# Patient Record
Sex: Female | Born: 1940 | Race: Black or African American | Hispanic: No | Smoking: Former smoker
Health system: Southern US, Community
[De-identification: ages and names within clinical notes are randomized; demographics above are authoritative.]

## PROBLEM LIST (undated history)

## (undated) DIAGNOSIS — C50919 Malignant neoplasm of unspecified site of unspecified female breast: Secondary | ICD-10-CM

## (undated) DIAGNOSIS — E7800 Pure hypercholesterolemia, unspecified: Secondary | ICD-10-CM

## (undated) DIAGNOSIS — K219 Gastro-esophageal reflux disease without esophagitis: Secondary | ICD-10-CM

## (undated) DIAGNOSIS — M353 Polymyalgia rheumatica: Secondary | ICD-10-CM

## (undated) DIAGNOSIS — R011 Cardiac murmur, unspecified: Secondary | ICD-10-CM

## (undated) DIAGNOSIS — M1990 Unspecified osteoarthritis, unspecified site: Secondary | ICD-10-CM

## (undated) DIAGNOSIS — R519 Headache, unspecified: Secondary | ICD-10-CM

## (undated) DIAGNOSIS — I10 Essential (primary) hypertension: Secondary | ICD-10-CM

## (undated) DIAGNOSIS — H269 Unspecified cataract: Secondary | ICD-10-CM

## (undated) DIAGNOSIS — G5600 Carpal tunnel syndrome, unspecified upper limb: Secondary | ICD-10-CM

## (undated) DIAGNOSIS — N189 Chronic kidney disease, unspecified: Secondary | ICD-10-CM

## (undated) DIAGNOSIS — K589 Irritable bowel syndrome without diarrhea: Secondary | ICD-10-CM

## (undated) DIAGNOSIS — E119 Type 2 diabetes mellitus without complications: Secondary | ICD-10-CM

## (undated) DIAGNOSIS — E039 Hypothyroidism, unspecified: Secondary | ICD-10-CM

## (undated) DIAGNOSIS — M797 Fibromyalgia: Secondary | ICD-10-CM

## (undated) HISTORY — PX: TONSILLECTOMY: SUR1361

## (undated) HISTORY — PX: EYE SURGERY: SHX253

## (undated) HISTORY — PX: UPPER GI ENDOSCOPY: SHX6162

---

## 1993-09-08 HISTORY — PX: BREAST EXCISIONAL BIOPSY: SUR124

## 2000-09-08 DIAGNOSIS — C50919 Malignant neoplasm of unspecified site of unspecified female breast: Secondary | ICD-10-CM

## 2000-09-08 HISTORY — DX: Malignant neoplasm of unspecified site of unspecified female breast: C50.919

## 2000-09-08 HISTORY — PX: MASTECTOMY: SHX3

## 2004-10-30 ENCOUNTER — Ambulatory Visit: Payer: Self-pay | Admitting: Oncology

## 2005-03-12 ENCOUNTER — Ambulatory Visit: Payer: Self-pay | Admitting: Internal Medicine

## 2005-04-30 ENCOUNTER — Ambulatory Visit: Payer: Self-pay | Admitting: Oncology

## 2005-05-09 ENCOUNTER — Ambulatory Visit: Payer: Self-pay | Admitting: Oncology

## 2005-10-18 ENCOUNTER — Other Ambulatory Visit: Payer: Self-pay

## 2005-10-18 ENCOUNTER — Emergency Department: Payer: Self-pay | Admitting: Emergency Medicine

## 2005-10-27 ENCOUNTER — Ambulatory Visit: Payer: Self-pay | Admitting: Oncology

## 2005-11-06 ENCOUNTER — Ambulatory Visit: Payer: Self-pay | Admitting: Oncology

## 2006-04-13 ENCOUNTER — Ambulatory Visit: Payer: Self-pay | Admitting: Internal Medicine

## 2006-04-27 ENCOUNTER — Ambulatory Visit: Payer: Self-pay | Admitting: Oncology

## 2006-05-19 ENCOUNTER — Ambulatory Visit: Payer: Self-pay | Admitting: Internal Medicine

## 2006-10-26 ENCOUNTER — Ambulatory Visit: Payer: Self-pay | Admitting: Oncology

## 2006-11-12 ENCOUNTER — Ambulatory Visit: Payer: Self-pay | Admitting: Gastroenterology

## 2007-01-07 ENCOUNTER — Ambulatory Visit: Payer: Self-pay | Admitting: Oncology

## 2007-01-11 ENCOUNTER — Other Ambulatory Visit: Payer: Self-pay

## 2007-01-12 ENCOUNTER — Inpatient Hospital Stay: Payer: Self-pay | Admitting: Internal Medicine

## 2007-01-29 ENCOUNTER — Ambulatory Visit: Payer: Self-pay | Admitting: Oncology

## 2007-02-07 ENCOUNTER — Ambulatory Visit: Payer: Self-pay | Admitting: Oncology

## 2007-03-09 ENCOUNTER — Ambulatory Visit: Payer: Self-pay | Admitting: Oncology

## 2007-04-20 ENCOUNTER — Ambulatory Visit: Payer: Self-pay | Admitting: Internal Medicine

## 2007-05-10 ENCOUNTER — Ambulatory Visit: Payer: Self-pay | Admitting: Oncology

## 2007-05-19 ENCOUNTER — Ambulatory Visit: Payer: Self-pay | Admitting: Oncology

## 2007-06-09 ENCOUNTER — Ambulatory Visit: Payer: Self-pay | Admitting: Oncology

## 2007-10-26 IMAGING — US US PELV - US TRANSVAGINAL
1 series · 17 of 25 positions shown · non-contrast
Comparison: none

REASON FOR EXAM: ascites follow up
COMMENTS:

[Series 1: us pelv - us transvaginal · 17 of 27 slices shown]
[im 1/27]
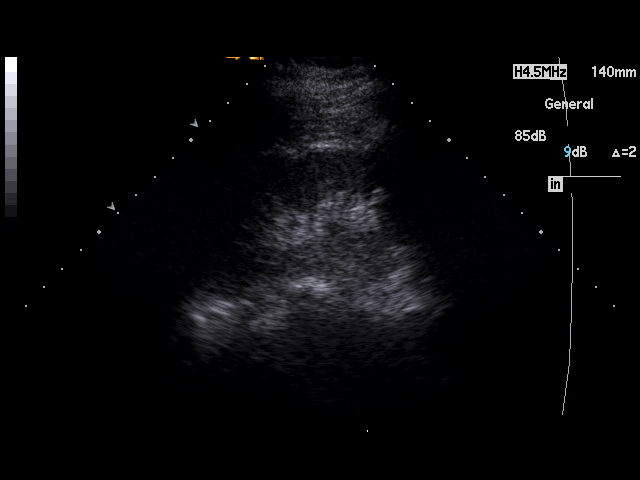
[im 3/27]
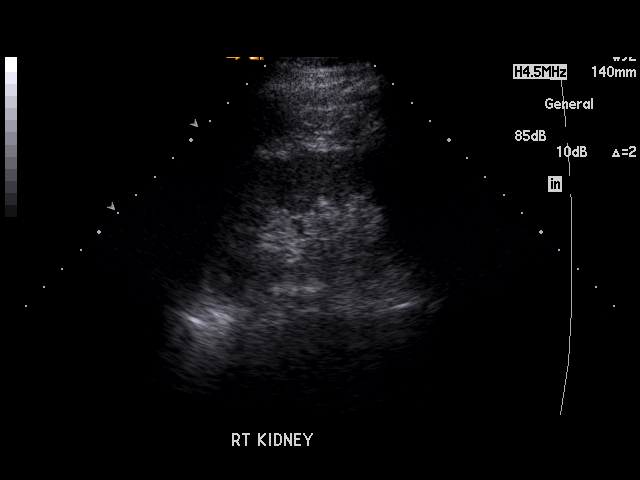
[im 4/27]
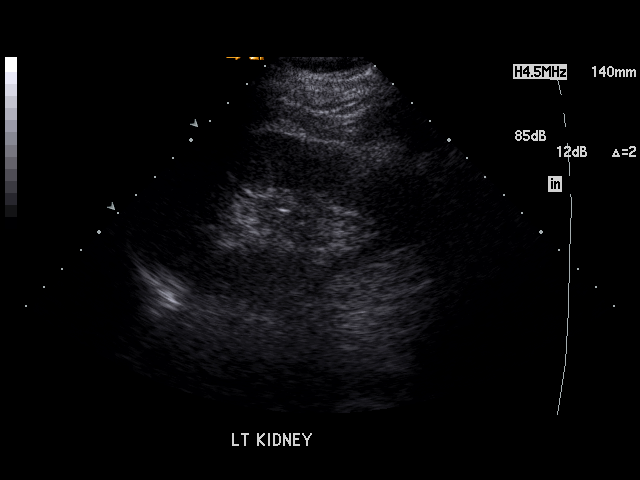
[im 6/27]
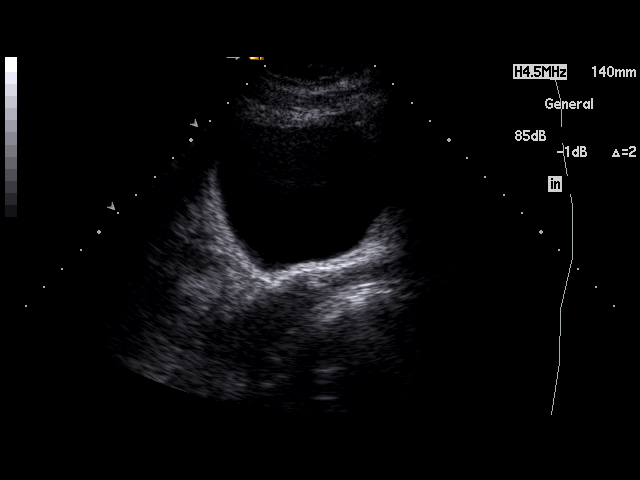
[im 7/27]
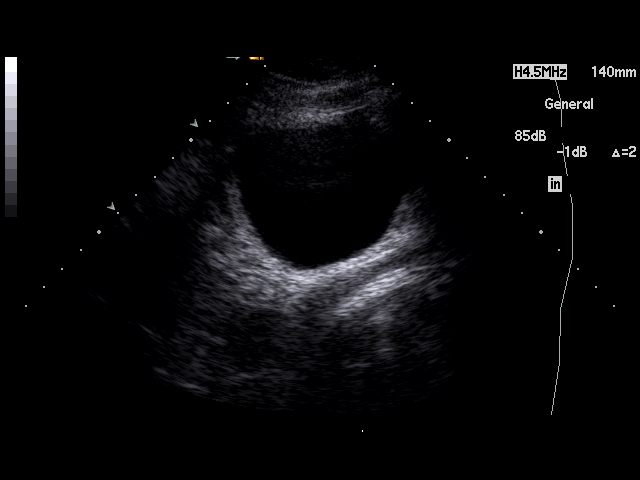
[im 9/27]
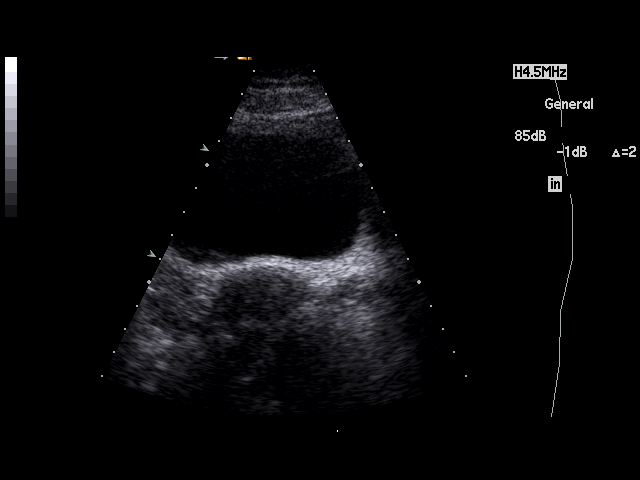
[im 10/27]
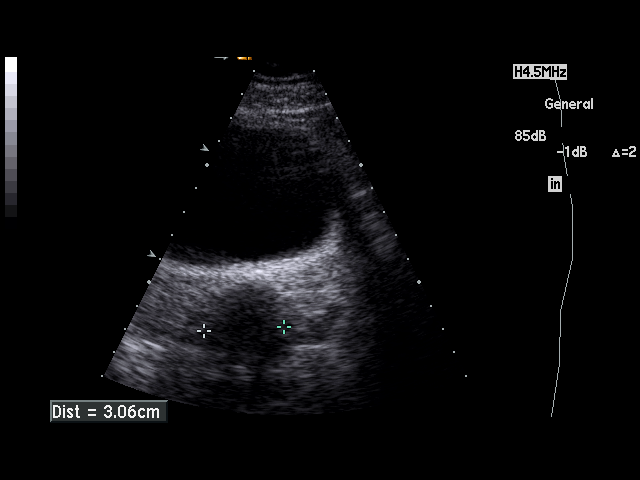
[im 12/27]
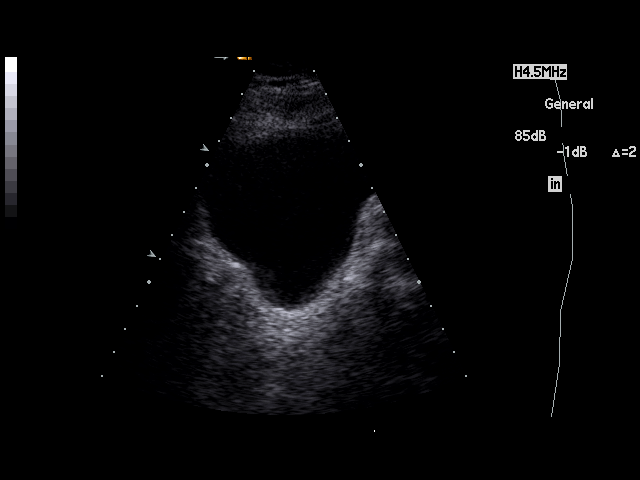
[im 14/27]
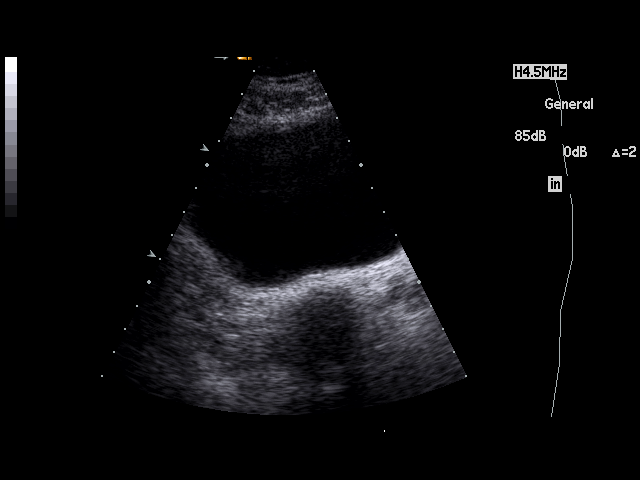
[im 15/27]
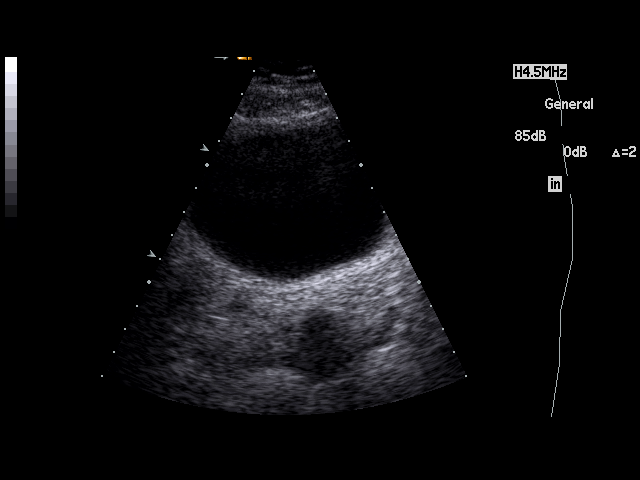
[im 17/27]
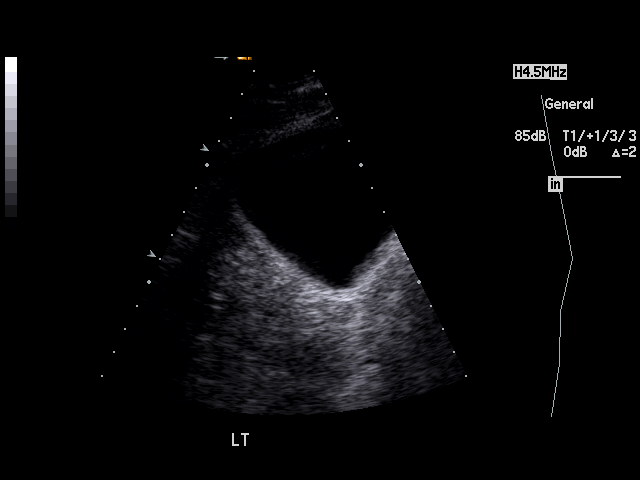
[im 18/27]
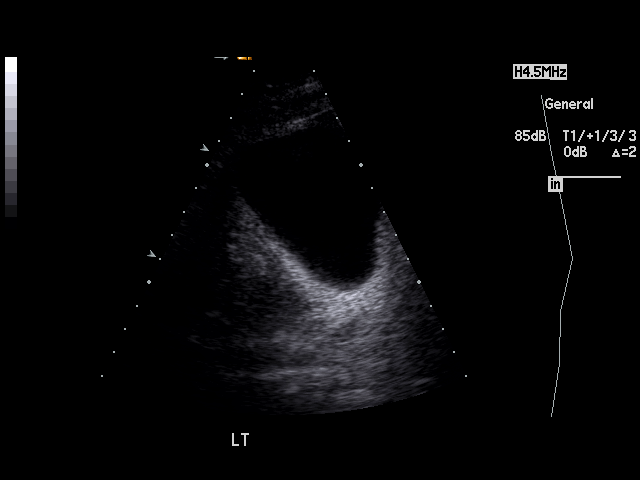
[im 20/27]
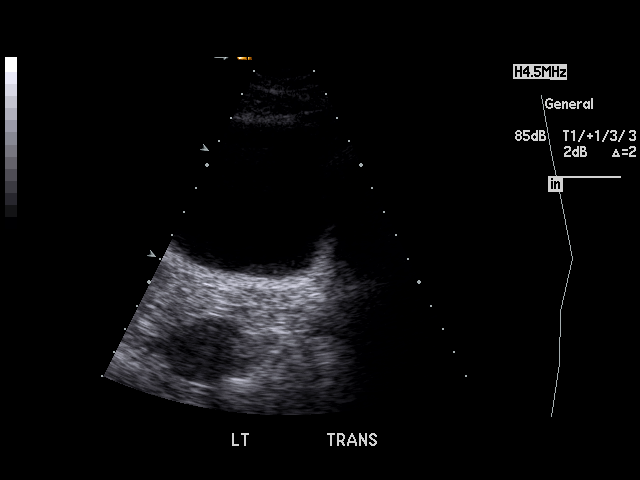
[im 21/27]
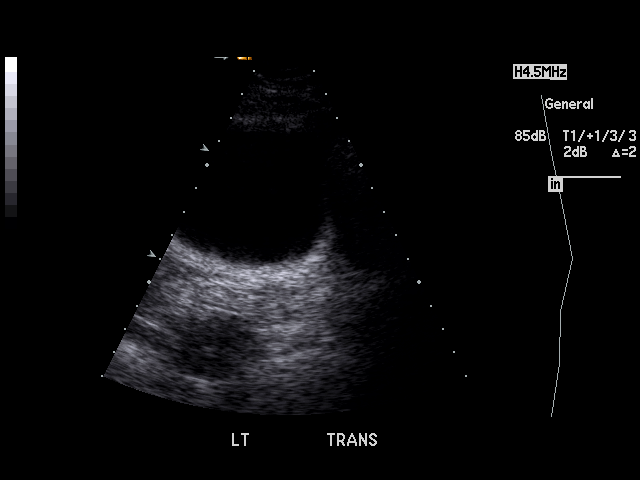
[im 23/27]
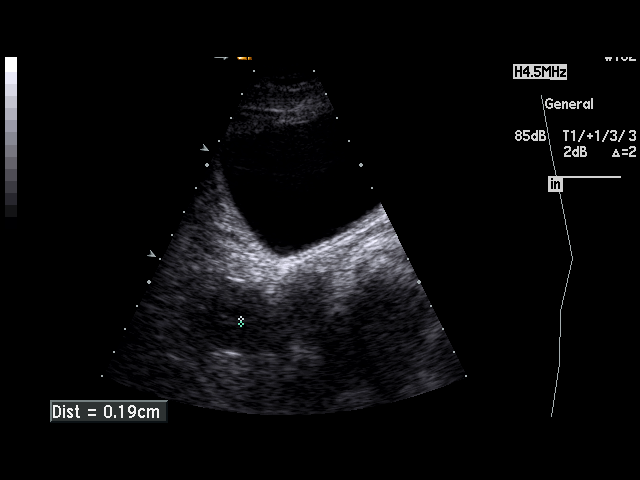
[im 24/27]
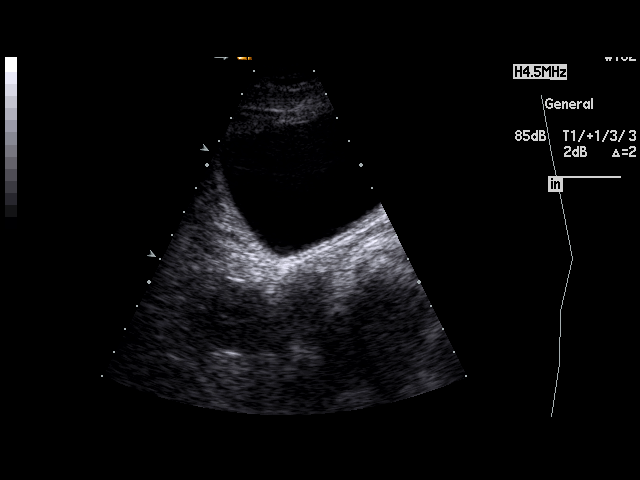
[im 27/27]
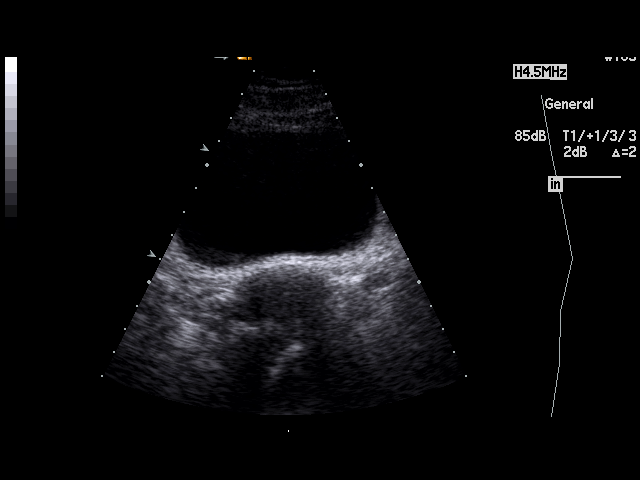

[17 of 25 positions shown; findings below may reference images not displayed]

PROCEDURE:     US  - US PELVIS MASS EXAM  - [DATE]  [DATE] [DATE]  [DATE]

RESULT:     The uterus measures 5.0 cm x 2.65 cm x 3.06 cm. The endometrium
measures 1.9 mm in thickness. No uterine mass lesions are seen. The ovaries
are not visualized and apparently are atrophic or have been removed. No
abnormal adnexal masses are seen. No free fluid is seen in the pelvis. The
urinary bladder is normal in appearance.
IMPRESSION: 1. No significant abnormalities are noted.
2. No free fluid is observed in the pelvis.
3. The ovaries are not visualized and apparently are atrophic or have been
removed.

## 2007-11-07 ENCOUNTER — Ambulatory Visit: Payer: Self-pay | Admitting: Oncology

## 2007-11-16 ENCOUNTER — Ambulatory Visit: Payer: Self-pay | Admitting: Oncology

## 2007-12-08 ENCOUNTER — Ambulatory Visit: Payer: Self-pay | Admitting: Oncology

## 2008-02-14 ENCOUNTER — Ambulatory Visit: Payer: Self-pay | Admitting: Oncology

## 2008-05-09 ENCOUNTER — Ambulatory Visit: Payer: Self-pay | Admitting: Oncology

## 2008-05-24 ENCOUNTER — Ambulatory Visit: Payer: Self-pay | Admitting: Oncology

## 2008-06-08 ENCOUNTER — Ambulatory Visit: Payer: Self-pay | Admitting: Oncology

## 2008-09-06 ENCOUNTER — Emergency Department: Payer: Self-pay | Admitting: Internal Medicine

## 2008-11-06 ENCOUNTER — Ambulatory Visit: Payer: Self-pay | Admitting: Oncology

## 2008-12-01 ENCOUNTER — Ambulatory Visit: Payer: Self-pay | Admitting: Oncology

## 2008-12-07 ENCOUNTER — Ambulatory Visit: Payer: Self-pay | Admitting: Oncology

## 2009-02-14 ENCOUNTER — Ambulatory Visit: Payer: Self-pay | Admitting: Oncology

## 2009-05-09 ENCOUNTER — Ambulatory Visit: Payer: Self-pay | Admitting: Oncology

## 2009-05-23 ENCOUNTER — Ambulatory Visit: Payer: Self-pay | Admitting: Oncology

## 2009-06-08 ENCOUNTER — Ambulatory Visit: Payer: Self-pay | Admitting: Oncology

## 2010-02-02 ENCOUNTER — Emergency Department: Payer: Self-pay | Admitting: Emergency Medicine

## 2010-02-18 ENCOUNTER — Ambulatory Visit: Payer: Self-pay | Admitting: Oncology

## 2010-06-04 ENCOUNTER — Ambulatory Visit: Payer: Self-pay | Admitting: Oncology

## 2011-02-20 ENCOUNTER — Ambulatory Visit: Payer: Self-pay | Admitting: Oncology

## 2011-03-07 ENCOUNTER — Ambulatory Visit: Payer: Self-pay | Admitting: Gastroenterology

## 2011-04-17 ENCOUNTER — Ambulatory Visit: Payer: Self-pay | Admitting: Gastroenterology

## 2011-06-05 ENCOUNTER — Ambulatory Visit: Payer: Self-pay | Admitting: Oncology

## 2011-06-09 ENCOUNTER — Ambulatory Visit: Payer: Self-pay | Admitting: Oncology

## 2012-03-01 ENCOUNTER — Ambulatory Visit: Payer: Self-pay | Admitting: Internal Medicine

## 2012-08-31 ENCOUNTER — Emergency Department: Payer: Self-pay | Admitting: Emergency Medicine

## 2012-08-31 LAB — URINALYSIS, COMPLETE
Bacteria: NONE SEEN
Blood: NEGATIVE
Glucose,UR: NEGATIVE mg/dL (ref 0–75)
Ketone: NEGATIVE
Protein: NEGATIVE
Specific Gravity: 1.021 (ref 1.003–1.030)
Squamous Epithelial: <1
WBC UR: 15 /HPF (ref 0–5)

## 2012-08-31 LAB — COMPREHENSIVE METABOLIC PANEL
Alkaline Phosphatase: 112 U/L (ref 50–136)
BUN: 16 mg/dL (ref 7–18)
Bilirubin,Total: 0.5 mg/dL (ref 0.2–1.0)
Calcium, Total: 9.2 mg/dL (ref 8.5–10.1)
Chloride: 106 mmol/L (ref 98–107)
Co2: 28 mmol/L (ref 21–32)
EGFR (African American): 57 — ABNORMAL LOW
EGFR (Non-African Amer.): 50 — ABNORMAL LOW
Glucose: 139 mg/dL — ABNORMAL HIGH (ref 65–99)
Potassium: 3.8 mmol/L (ref 3.5–5.1)
SGOT(AST): 19 U/L (ref 15–37)
Sodium: 141 mmol/L (ref 136–145)
Total Protein: 8.2 g/dL (ref 6.4–8.2)

## 2012-08-31 LAB — CBC
HCT: 32.5 % — ABNORMAL LOW (ref 35.0–47.0)
HGB: 10.7 g/dL — ABNORMAL LOW (ref 12.0–16.0)
MCH: 27.5 pg (ref 26.0–34.0)
MCHC: 32.8 g/dL (ref 32.0–36.0)
MCV: 84 fL (ref 80–100)
Platelet: 250 10*3/uL (ref 150–440)
RDW: 13.2 % (ref 11.5–14.5)

## 2013-03-02 ENCOUNTER — Ambulatory Visit: Payer: Self-pay | Admitting: Internal Medicine

## 2013-12-08 DIAGNOSIS — K219 Gastro-esophageal reflux disease without esophagitis: Secondary | ICD-10-CM | POA: Insufficient documentation

## 2014-03-03 ENCOUNTER — Ambulatory Visit: Payer: Self-pay | Admitting: Internal Medicine

## 2014-06-19 DIAGNOSIS — M353 Polymyalgia rheumatica: Secondary | ICD-10-CM | POA: Insufficient documentation

## 2014-06-19 DIAGNOSIS — I10 Essential (primary) hypertension: Secondary | ICD-10-CM | POA: Insufficient documentation

## 2014-06-19 DIAGNOSIS — E034 Atrophy of thyroid (acquired): Secondary | ICD-10-CM | POA: Insufficient documentation

## 2014-06-19 DIAGNOSIS — E7800 Pure hypercholesterolemia, unspecified: Secondary | ICD-10-CM | POA: Insufficient documentation

## 2014-12-25 ENCOUNTER — Other Ambulatory Visit: Payer: Self-pay | Admitting: Internal Medicine

## 2014-12-25 DIAGNOSIS — Z853 Personal history of malignant neoplasm of breast: Secondary | ICD-10-CM

## 2015-03-05 ENCOUNTER — Ambulatory Visit
Admission: RE | Admit: 2015-03-05 | Discharge: 2015-03-05 | Disposition: A | Discharge status: Home / Self Care | Payer: Medicare Other | Source: Ambulatory Visit | Attending: Internal Medicine | Admitting: Internal Medicine

## 2015-03-05 ENCOUNTER — Other Ambulatory Visit: Payer: Self-pay | Admitting: Internal Medicine

## 2015-03-05 DIAGNOSIS — Z853 Personal history of malignant neoplasm of breast: Secondary | ICD-10-CM

## 2015-03-05 DIAGNOSIS — Z1231 Encounter for screening mammogram for malignant neoplasm of breast: Secondary | ICD-10-CM | POA: Diagnosis not present

## 2015-03-05 HISTORY — DX: Malignant neoplasm of unspecified site of unspecified female breast: C50.919

## 2015-10-18 ENCOUNTER — Emergency Department
Admission: EM | Admit: 2015-10-18 | Discharge: 2015-10-18 | Disposition: A | Discharge status: Home / Self Care | Payer: Medicare Other | Attending: Emergency Medicine | Admitting: Emergency Medicine

## 2015-10-18 DIAGNOSIS — Y9389 Activity, other specified: Secondary | ICD-10-CM | POA: Insufficient documentation

## 2015-10-18 DIAGNOSIS — M25552 Pain in left hip: Secondary | ICD-10-CM | POA: Diagnosis present

## 2015-10-18 DIAGNOSIS — M7062 Trochanteric bursitis, left hip: Secondary | ICD-10-CM | POA: Insufficient documentation

## 2015-10-18 MED ORDER — PREDNISONE 10 MG PO TABS: 10.0000 mg | ORAL_TABLET | ORAL | Status: DC

## 2015-10-18 NOTE — ED Notes (Addendum)
PT has had left hip pain for years no injury.  Saw pmd monday for increased pain and dx with bursitis and had injection to hip.  States pain worse ambulatory to triage.

## 2015-10-18 NOTE — Discharge Instructions (Signed)
Hip Bursitis Bursitis is a swelling and soreness (inflammation) of a fluid-filled sac (bursa). This sac overlies and protects the joints.  CAUSES   Injury.  Overuse of the muscles surrounding the joint.  Arthritis.  Gout.  Infection.  Cold weather.  Inadequate warm-up and conditioning prior to activities. The cause may not be known.  SYMPTOMS   Mild to severe irritation.  Tenderness and swelling over the outside of the hip.  Pain with motion of the hip.  If the bursa becomes infected, a fever may be present. Redness, tenderness, and warmth will develop over the hip. Symptoms usually lessen in 3 to 4 weeks with treatment, but can come back. TREATMENT If conservative treatment does not work, your caregiver may advise draining the bursa and injecting cortisone into the area. This may speed up the healing process. This may also be used as an initial treatment of choice. HOME CARE INSTRUCTIONS   Apply ice to the affected area for 15-20 minutes every 3 to 4 hours while awake for the first 2 days. Put the ice in a plastic bag and place a towel between the bag of ice and your skin.  Rest the painful joint as much as possible, but continue to put the joint through a normal range of motion at least 4 times per day. When the pain lessens, begin normal, slow movements and usual activities to help prevent stiffness of the hip.  Only take over-the-counter or prescription medicines for pain, discomfort, or fever as directed by your caregiver.  Use crutches to limit weight bearing on the hip joint, if advised.  Elevate your painful hip to reduce swelling. Use pillows for propping and cushioning your legs and hips.  Gentle massage may provide comfort and decrease swelling. SEEK IMMEDIATE MEDICAL CARE IF:   Your pain increases even during treatment, or you are not improving.  You have a fever.  You have heat and inflammation over the involved bursa.  You have any other questions or  concerns. MAKE SURE YOU:   Understand these instructions.  Will watch your condition.  Will get help right away if you are not doing well or get worse.   This information is not intended to replace advice given to you by your health care provider. Make sure you discuss any questions you have with your health care provider.   Document Released: 02/14/2002 Document Revised: 11/17/2011 Document Reviewed: 03/27/2015 Elsevier Interactive Patient Education 2016 [employer]  

## 2015-10-18 NOTE — ED Notes (Signed)
Pt ambulatory to treatment room with steady gait. Pt states L hip pain worse since seeing Dr Monday after getting a shot. Denies tingling or numbness, denies injury.

## 2015-10-18 NOTE — ED Provider Notes (Signed)
[facility] Emergency Department Provider Note  ____________________________________________  Time seen: Approximately 9:05 PM  I have reviewed the triage vital signs and the nursing notes.   HISTORY  Chief Complaint Hip Pain    [name] is a 75 y.o. female who presents emergency department complaining of left hip pain. Patient has a known diagnosis of bursitis and is being treated by orthopedics at [facility] clinic.Patient received a steroid intra-articular injection on Monday and states that the pain increased rather than decreased. Patient denies any recent injuries to area. Patient is unable to take nonsteroidal anti-inflammatory drugs due to a history of gastric ulcers. Patient is placed on all. Low dose of steroids for anti-inflammatory purposes. The patient denies any numbness or tingling distally. She denies any back pain. She denies any other symptom or complaint at this time.   Past Medical History  Diagnosis Date  . Breast cancer 2002    Right breast ca with chemo and rad tx. Left breast ca 1995 with rad tx.     There are no active problems to display for this patient.   Past Surgical History  Procedure Laterality Date  . Mastectomy Right 2002    Breast ca  . Breast excisional biopsy Left 1995    postive for breast ca    Current Outpatient Rx  Name  Route  Sig  Dispense  Refill  . predniSONE (DELTASONE) 10 MG tablet   Oral   Take 1 tablet (10 mg total) by mouth as directed.   21 tablet   0     Take on a daily basis of 6, 5, 4, 3, 2, 1     Allergies Ultram  No family history on file.  Social History Social History  Substance Use Topics  . Smoking status: Not on file  . Smokeless tobacco: Not on file  . Alcohol Use: Not on file     Review of Systems  Constitutional: No fever/chills Cardiovascular: no chest pain. Respiratory: no cough. No SOB. Musculoskeletal: Negative for back pain. Positive for left  pain. Skin: Negative for rash. Neurological: Negative for headaches, focal weakness or numbness. 10-point ROS otherwise negative.  ____________________________________________   PHYSICAL EXAM:  VITAL SIGNS: ED Triage Vitals  Enc Vitals Group     BP 10/18/15 2057 154/67 mmHg     Pulse Rate 10/18/15 2057 69     Resp 10/18/15 2057 18     Temp 10/18/15 2057 96.3 F (35.7 C)     Temp Source 10/18/15 2057 Oral     SpO2 10/18/15 2057 100 %     Weight 10/18/15 2057 140 lb (63.504 kg)     Height 10/18/15 2057 5\' 5"  (1.651 m)     Head Cir --      Peak Flow --      Pain Score 10/18/15 2058 10     Pain Loc --      Pain Edu? --      Excl. in [place]? --      Constitutional: Alert and oriented. Well appearing and in no acute distress. Eyes: Conjunctivae are normal. PERRL. EOMI. Head: Atraumatic. Neck: No stridor.   Hematological/Lymphatic/Immunilogical: No cervical lymphadenopathy. Cardiovascular: Normal rate, regular rhythm. Normal S1 and S2.  Good peripheral circulation. Respiratory: Normal respiratory effort without tachypnea or retractions. Lungs CTAB. Musculoskeletal: No lower extremity tenderness nor edema.  No joint effusions. No visible deformity to left hip compared with right. Full range of motion hip. Patient is ambulating with  a slight limp to left leg. Patient is tender to palpation over the greater trochanteric region left hip. No palpable abnormality. Exam of lumbar spine and left knee is unremarkable. Dorsalis pedis pulses appreciated. Sensation intact distal left lower extremity. Neurologic:  Normal speech and language. No gross focal neurologic deficits are appreciated.  Skin:  Skin is warm, dry and intact. No rash noted. Psychiatric: Mood and affect are normal. Speech and behavior are normal. Patient exhibits appropriate insight and judgement.   ____________________________________________   LABS (all labs ordered are listed, but only abnormal results are  displayed)  Labs Reviewed - No data to display ____________________________________________  EKG   ____________________________________________  RADIOLOGY   No results found.  ____________________________________________    PROCEDURES  Procedure(s) performed:       Medications - No data to display   ____________________________________________   INITIAL IMPRESSION / ASSESSMENT AND PLAN / ED COURSE  Pertinent labs & imaging results that were available during my care of the patient were reviewed by me and considered in my medical decision making (see chart for details).  Patient's diagnosis is consistent with left trochanteric bursitis. Symptoms are likely related to injection 3 days prior. Patient is informed that this is common with joint injections and that symptoms should resolve. Due to patient's history of GI ulcers and bleeding patient will be placed on steroids for additional inflammatory control. She'll be placed on a prednisone taper. Patient is to follow up with her orthopedic surgeon if symptoms persist past this treatment course. Patient is given ED precautions to return to the ED for any worsening or new symptoms.     ____________________________________________  FINAL CLINICAL IMPRESSION(S) / ED DIAGNOSES  Final diagnoses:  Trochanteric bursitis of left hip      NEW MEDICATIONS STARTED DURING THIS VISIT:  New Prescriptions   PREDNISONE (DELTASONE) 10 MG TABLET    Take 1 tablet (10 mg total) by mouth as directed.        Charline Bills Cuthriell, PA-C 10/18/15 2116  Schuyler Amor, MD 10/19/15 [phone]

## 2015-12-31 ENCOUNTER — Other Ambulatory Visit: Payer: Self-pay | Admitting: Internal Medicine

## 2015-12-31 DIAGNOSIS — Z1231 Encounter for screening mammogram for malignant neoplasm of breast: Secondary | ICD-10-CM

## 2016-01-26 ENCOUNTER — Observation Stay: Payer: Medicare Other

## 2016-01-26 ENCOUNTER — Observation Stay
Admission: EM | Admit: 2016-01-26 | Discharge: 2016-01-28 | Disposition: A | Discharge status: Home / Self Care | Payer: Medicare Other | Attending: Internal Medicine | Admitting: Internal Medicine

## 2016-01-26 ENCOUNTER — Encounter: Payer: Self-pay | Admitting: Emergency Medicine

## 2016-01-26 ENCOUNTER — Emergency Department: Payer: Medicare Other

## 2016-01-26 DIAGNOSIS — K580 Irritable bowel syndrome with diarrhea: Secondary | ICD-10-CM | POA: Diagnosis not present

## 2016-01-26 DIAGNOSIS — Z885 Allergy status to narcotic agent status: Secondary | ICD-10-CM | POA: Insufficient documentation

## 2016-01-26 DIAGNOSIS — M797 Fibromyalgia: Secondary | ICD-10-CM | POA: Insufficient documentation

## 2016-01-26 DIAGNOSIS — R112 Nausea with vomiting, unspecified: Secondary | ICD-10-CM | POA: Insufficient documentation

## 2016-01-26 DIAGNOSIS — E876 Hypokalemia: Secondary | ICD-10-CM | POA: Insufficient documentation

## 2016-01-26 DIAGNOSIS — E860 Dehydration: Secondary | ICD-10-CM | POA: Diagnosis not present

## 2016-01-26 DIAGNOSIS — R55 Syncope and collapse: Principal | ICD-10-CM | POA: Diagnosis present

## 2016-01-26 DIAGNOSIS — R918 Other nonspecific abnormal finding of lung field: Secondary | ICD-10-CM | POA: Insufficient documentation

## 2016-01-26 DIAGNOSIS — M4316 Spondylolisthesis, lumbar region: Secondary | ICD-10-CM | POA: Insufficient documentation

## 2016-01-26 DIAGNOSIS — I10 Essential (primary) hypertension: Secondary | ICD-10-CM | POA: Diagnosis not present

## 2016-01-26 DIAGNOSIS — R109 Unspecified abdominal pain: Secondary | ICD-10-CM

## 2016-01-26 DIAGNOSIS — K449 Diaphragmatic hernia without obstruction or gangrene: Secondary | ICD-10-CM | POA: Diagnosis not present

## 2016-01-26 DIAGNOSIS — I371 Nonrheumatic pulmonary valve insufficiency: Secondary | ICD-10-CM | POA: Insufficient documentation

## 2016-01-26 DIAGNOSIS — Z8249 Family history of ischemic heart disease and other diseases of the circulatory system: Secondary | ICD-10-CM | POA: Insufficient documentation

## 2016-01-26 DIAGNOSIS — Z853 Personal history of malignant neoplasm of breast: Secondary | ICD-10-CM | POA: Diagnosis not present

## 2016-01-26 DIAGNOSIS — Z87891 Personal history of nicotine dependence: Secondary | ICD-10-CM | POA: Diagnosis not present

## 2016-01-26 DIAGNOSIS — I340 Nonrheumatic mitral (valve) insufficiency: Secondary | ICD-10-CM | POA: Diagnosis not present

## 2016-01-26 DIAGNOSIS — I672 Cerebral atherosclerosis: Secondary | ICD-10-CM | POA: Insufficient documentation

## 2016-01-26 DIAGNOSIS — Z888 Allergy status to other drugs, medicaments and biological substances status: Secondary | ICD-10-CM | POA: Insufficient documentation

## 2016-01-26 DIAGNOSIS — M1990 Unspecified osteoarthritis, unspecified site: Secondary | ICD-10-CM | POA: Diagnosis not present

## 2016-01-26 HISTORY — DX: Fibromyalgia: M79.7

## 2016-01-26 HISTORY — DX: Irritable bowel syndrome, unspecified: K58.9

## 2016-01-26 HISTORY — DX: Essential (primary) hypertension: I10

## 2016-01-26 HISTORY — DX: Unspecified osteoarthritis, unspecified site: M19.90

## 2016-01-26 LAB — CBC WITH DIFFERENTIAL/PLATELET
Basophils Absolute: 0 10*3/uL (ref 0–0.1)
Basophils Relative: 1 %
EOS ABS: 0.1 10*3/uL (ref 0–0.7)
Eosinophils Relative: 1 %
HCT: 38.4 % (ref 35.0–47.0)
HEMOGLOBIN: 12.6 g/dL (ref 12.0–16.0)
LYMPHS ABS: 1.2 10*3/uL (ref 1.0–3.6)
Lymphocytes Relative: 14 %
MCH: 27.8 pg (ref 26.0–34.0)
MCHC: 32.9 g/dL (ref 32.0–36.0)
MCV: 84.6 fL (ref 80.0–100.0)
MONO ABS: 0.5 10*3/uL (ref 0.2–0.9)
Neutro Abs: 6.5 10*3/uL (ref 1.4–6.5)
Neutrophils Relative %: 78 %
Platelets: 320 10*3/uL (ref 150–440)
RBC: 4.54 MIL/uL (ref 3.80–5.20)
RDW: 13.5 % (ref 11.5–14.5)
WBC: 8.3 10*3/uL (ref 3.6–11.0)

## 2016-01-26 LAB — COMPREHENSIVE METABOLIC PANEL
ALT: 33 U/L (ref 14–54)
AST: 32 U/L (ref 15–41)
Albumin: 3.8 g/dL (ref 3.5–5.0)
Alkaline Phosphatase: 76 U/L (ref 38–126)
Anion gap: 7 (ref 5–15)
BILIRUBIN TOTAL: 0.3 mg/dL (ref 0.3–1.2)
BUN: 21 mg/dL — AB (ref 6–20)
CHLORIDE: 102 mmol/L (ref 101–111)
CO2: 30 mmol/L (ref 22–32)
CREATININE: 1.18 mg/dL — AB (ref 0.44–1.00)
Calcium: 9.2 mg/dL (ref 8.9–10.3)
GFR calc Af Amer: 51 mL/min — ABNORMAL LOW (ref >60)
GFR calc non Af Amer: 44 mL/min — ABNORMAL LOW (ref >60)
Glucose, Bld: 149 mg/dL — ABNORMAL HIGH (ref 65–99)
Potassium: 3.4 mmol/L — ABNORMAL LOW (ref 3.5–5.1)
Sodium: 139 mmol/L (ref 135–145)
Total Protein: 7 g/dL (ref 6.5–8.1)

## 2016-01-26 LAB — MAGNESIUM: MAGNESIUM: 1.6 mg/dL — AB (ref 1.7–2.4)

## 2016-01-26 LAB — TROPONIN I

## 2016-01-26 LAB — LACTIC ACID, PLASMA: LACTIC ACID, VENOUS: 2.1 mmol/L — AB (ref 0.5–2.0)

## 2016-01-26 MED ORDER — ONDANSETRON HCL 4 MG PO TABS: 4.0000 mg | ORAL_TABLET | Freq: Four times a day (QID) | ORAL | Status: DC | PRN

## 2016-01-26 MED ORDER — DIATRIZOATE MEGLUMINE & SODIUM 66-10 % PO SOLN
15.0000 mL | ORAL | Status: AC
  Administered 2016-01-26: 15 mL via ORAL

## 2016-01-26 MED ORDER — OLOPATADINE HCL 0.1 % OP SOLN
2.0000 [drp] | Freq: Every day | OPHTHALMIC | Status: DC
  Administered 2016-01-27: 10:00:00 2 [drp] via OPHTHALMIC
  Filled 2016-01-26: qty 5

## 2016-01-26 MED ORDER — SODIUM CHLORIDE 0.9 % IV BOLUS (SEPSIS): 1000.0000 mL | Freq: Once | INTRAVENOUS | Status: DC

## 2016-01-26 MED ORDER — POTASSIUM CHLORIDE CRYS ER 10 MEQ PO TBCR
10.0000 meq | EXTENDED_RELEASE_TABLET | Freq: Every day | ORAL | Status: DC
  Administered 2016-01-27: 10:00:00 10 meq via ORAL
  Filled 2016-01-26 (×3): qty 1

## 2016-01-26 MED ORDER — ACETAMINOPHEN 325 MG PO TABS: 650.0000 mg | ORAL_TABLET | Freq: Four times a day (QID) | ORAL | Status: DC | PRN

## 2016-01-26 MED ORDER — ACETAMINOPHEN 650 MG RE SUPP: 650.0000 mg | Freq: Four times a day (QID) | RECTAL | Status: DC | PRN

## 2016-01-26 MED ORDER — PANTOPRAZOLE SODIUM 40 MG PO TBEC
40.0000 mg | DELAYED_RELEASE_TABLET | Freq: Every day | ORAL | Status: DC
  Administered 2016-01-27: 10:00:00 40 mg via ORAL
  Filled 2016-01-26 (×2): qty 1

## 2016-01-26 MED ORDER — IOPAMIDOL (ISOVUE-300) INJECTION 61%
100.0000 mL | Freq: Once | INTRAVENOUS | Status: AC | PRN
  Administered 2016-01-26: 22:00:00 100 mL via INTRAVENOUS

## 2016-01-26 MED ORDER — LEVOTHYROXINE SODIUM 75 MCG PO TABS
75.0000 ug | ORAL_TABLET | Freq: Every day | ORAL | Status: DC
  Administered 2016-01-27: 08:00:00 75 ug via ORAL
  Filled 2016-01-26 (×2): qty 1

## 2016-01-26 MED ORDER — MORPHINE SULFATE (PF) 2 MG/ML IV SOLN
2.0000 mg | Freq: Once | INTRAVENOUS | Status: AC
  Administered 2016-01-26: 2 mg via INTRAVENOUS
  Filled 2016-01-26: qty 1

## 2016-01-26 MED ORDER — ASPIRIN EC 81 MG PO TBEC
81.0000 mg | DELAYED_RELEASE_TABLET | Freq: Every day | ORAL | Status: DC
  Administered 2016-01-27: 10:00:00 81 mg via ORAL
  Filled 2016-01-26 (×2): qty 1

## 2016-01-26 MED ORDER — PRAVASTATIN SODIUM 10 MG PO TABS
10.0000 mg | ORAL_TABLET | Freq: Every day | ORAL | Status: DC
  Administered 2016-01-27: 10 mg via ORAL
  Filled 2016-01-26 (×3): qty 1

## 2016-01-26 MED ORDER — ONDANSETRON HCL 4 MG/2ML IJ SOLN: 4.0000 mg | Freq: Four times a day (QID) | INTRAMUSCULAR | Status: DC | PRN

## 2016-01-26 MED ORDER — ONDANSETRON HCL 4 MG/2ML IJ SOLN
4.0000 mg | Freq: Once | INTRAMUSCULAR | Status: AC
  Administered 2016-01-26: 4 mg via INTRAVENOUS
  Filled 2016-01-26: qty 2

## 2016-01-26 MED ORDER — LISINOPRIL 10 MG PO TABS: 20.0000 mg | ORAL_TABLET | Freq: Two times a day (BID) | ORAL | Status: DC

## 2016-01-26 MED ORDER — ALBUTEROL SULFATE (2.5 MG/3ML) 0.083% IN NEBU: 2.5000 mg | INHALATION_SOLUTION | RESPIRATORY_TRACT | Status: DC | PRN

## 2016-01-26 MED ORDER — SODIUM CHLORIDE 0.9 % IV SOLN
Freq: Once | INTRAVENOUS | Status: AC
  Administered 2016-01-26: 15:00:00 via INTRAVENOUS

## 2016-01-26 MED ORDER — HYDROCHLOROTHIAZIDE 25 MG PO TABS: 25.0000 mg | ORAL_TABLET | Freq: Every day | ORAL | Status: DC

## 2016-01-26 MED ORDER — POTASSIUM CHLORIDE CRYS ER 20 MEQ PO TBCR
40.0000 meq | EXTENDED_RELEASE_TABLET | ORAL | Status: AC
  Administered 2016-01-26: 23:00:00 40 meq via ORAL
  Filled 2016-01-26: qty 2

## 2016-01-26 MED ORDER — PREDNISONE 5 MG PO TABS
2.5000 mg | ORAL_TABLET | Freq: Every day | ORAL | Status: DC
  Administered 2016-01-27: 10:00:00 2.5 mg via ORAL
  Filled 2016-01-26: qty 1
  Filled 2016-01-26: qty 0.5
  Filled 2016-01-26 (×2): qty 1

## 2016-01-26 MED ORDER — ENOXAPARIN SODIUM 40 MG/0.4ML ~~LOC~~ SOLN
40.0000 mg | SUBCUTANEOUS | Status: DC
  Administered 2016-01-27: 18:00:00 40 mg via SUBCUTANEOUS
  Filled 2016-01-26: qty 0.4

## 2016-01-26 MED ORDER — POTASSIUM CHLORIDE IN NACL 20-0.9 MEQ/L-% IV SOLN
INTRAVENOUS | Status: AC
  Administered 2016-01-26 – 2016-01-27 (×2): via INTRAVENOUS
  Filled 2016-01-26 (×3): qty 1000

## 2016-01-26 MED ORDER — AMLODIPINE BESYLATE 5 MG PO TABS
5.0000 mg | ORAL_TABLET | Freq: Every day | ORAL | Status: DC
  Administered 2016-01-27: 10:00:00 5 mg via ORAL
  Filled 2016-01-26: qty 1

## 2016-01-26 MED ORDER — SODIUM CHLORIDE 0.9% FLUSH
3.0000 mL | Freq: Two times a day (BID) | INTRAVENOUS | Status: DC
  Administered 2016-01-26 – 2016-01-27 (×2): 3 mL via INTRAVENOUS

## 2016-01-26 NOTE — Progress Notes (Signed)
Patient had sudden onset of hypotension with systolic blood pressures in the 60s. We cycled the blood pressure and it is at 74/48. Patient continues to feel weak and has increasing abdominal cramping.  No tenderness on repeat examination.  We will bolus normal saline 1 L now. Check lactic acid. No antibiotics.  Discussed with family.

## 2016-01-26 NOTE — ED Provider Notes (Addendum)
[facility] Emergency Department Provider Note   ____________________________________________  Time seen: Approximately 2:12 PM  I have reviewed the triage vital signs and the nursing notes.   HISTORY  Chief Complaint Loss of Consciousness   [name] is a 75 y.o. female patient reports she ate the same things everybody else did except for some pineapple cake which was the only thing she ate and no one else did. She subsequently developed nausea and diarrhea with stomach cramps. She went to the bathroom at least 5 times a day. She was on the toilet while visiting a friend house went she got all hot feeling and passed out. Family reports she was sitting on the toilet leaning back against the seat back. They've [place] to proceed later down and she woke up. She reports she feels okay now except for bad stomach cramps.   Past Medical History  Diagnosis Date  . Breast cancer ([place]) 2002    Right breast ca with chemo and rad tx. Left breast ca 1995 with rad tx.   . Hypertension   . Arthritis   . Fibromyalgia   . IBS (irritable bowel syndrome)     There are no active problems to display for this patient.   Past Surgical History  Procedure Laterality Date  . Mastectomy Right 2002    Breast ca  . Breast excisional biopsy Left 1995    postive for breast ca    Current Outpatient Rx  Name  Route  Sig  Dispense  Refill  . predniSONE (DELTASONE) 10 MG tablet   Oral   Take 1 tablet (10 mg total) by mouth as directed.   21 tablet   0     Take on a daily basis of 6, 5, 4, 3, 2, 1     Allergies Ultram  History reviewed. No pertinent family history.  Social History Social History  Substance Use Topics  . Smoking status: Former Research scientist (life sciences)  . Smokeless tobacco: None  . Alcohol Use: No    Review of Systems Constitutional: No fever/chills Eyes: No visual changes. ENT: No sore throat. Cardiovascular: Denies chest pain. Respiratory: Denies  shortness of breath. Gastrointestinal: The history of present illness Genitourinary: Negative for dysuria. Musculoskeletal: Negative for back pain. Skin: Negative for rash. Neurological: Negative for headaches, focal weakness or numbness. tive.  ____________________________________________   PHYSICAL EXAM:  VITAL SIGNS: ED Triage Vitals  Enc Vitals Group     BP 01/26/16 1326 137/57 mmHg     Pulse Rate 01/26/16 1326 68     Resp 01/26/16 1326 10     Temp 01/26/16 1326 97.9 F (36.6 C)     Temp src --      SpO2 01/26/16 1324 98 %     Weight 01/26/16 1326 135 lb (61.236 kg)     Height 01/26/16 1326 5\' 5"  (1.651 m)     Head Cir --      Peak Flow --      Pain Score --      Pain Loc --      Pain Edu? --      Excl. in [place]? --     Constitutional: Alert and oriented. Well appearing and in no acute distress. Eyes: Conjunctivae are normal. PERRL. EOMI. Head: Atraumatic. Nose: No congestion/rhinnorhea. Mouth/Throat: Mucous membranes are moist.  Oropharynx non-erythematous. Neck: No stridor. Cardiovascular: Normal rate, regular rhythm. Grossly normal heart sounds.  Good peripheral circulation. Respiratory: Normal respiratory effort.  No retractions.  Lungs CTAB. Gastrointestinal: Soft and nontender! No distention. No abdominal bruits. No CVA tenderness. Musculoskeletal: No lower extremity tenderness nor edema.  No joint effusions. Neurologic:  Normal speech and language. No gross focal neurologic deficits are appreciated. No gait instability. Skin:  Skin is warm, dry and intact. No rash noted. Psychiatric: Mood and affect are normal. Speech and behavior are normal.  ____________________________________________   LABS (all labs ordered are listed, but only abnormal results are displayed)  Labs Reviewed  CBC WITH DIFFERENTIAL/PLATELET  COMPREHENSIVE METABOLIC PANEL  TROPONIN I  URINALYSIS COMPLETEWITH MICROSCOPIC ([facility] ONLY)    ____________________________________________  EKG  EKG read and interpreted by me shows normal sinus rhythm rate of 70 normal axis no acute ST-T wave changes  RADIOLOGY   ____________________________________________   PROCEDURES  ____________________________________________   INITIAL IMPRESSION / ASSESSMENT AND PLAN / ED COURSE  Pertinent labs & imaging results that were available during my care of the patient were reviewed by me and considered in my medical decision making (see chart for details).  Patient is sitting on stretcher being unhooked from her monitor had an episode of staring straight ahead and became unresponsive woke up after maybe 2 minutes. Blood pressure on awakening was [id number] systolic she was in sinus rhythm when she was reattached to the heart monitor. She did urinate on herself while she was unconscious. ____________________________________________   FINAL CLINICAL IMPRESSION(S) / ED DIAGNOSES  Final diagnoses:  Syncope, unspecified syncope type      NEW MEDICATIONS STARTED DURING THIS VISIT:  New Prescriptions   No medications on file     Note:  This document was prepared using Dragon voice recognition software and may include unintentional dictation errors.    Nena Polio, MD 01/26/16 1516  Nena Polio, MD 01/26/16 [phone]

## 2016-01-26 NOTE — ED Notes (Signed)
Pt arrived by EMS at having a syncopal episode. Pt states she has had diarrhea this morning and was on the toilet when she had LOC. Pt denies dizziness/lightheadedness.

## 2016-01-26 NOTE — H&P (Signed)
[facility] [place] NAME: Janice Galloway    MR#:  [id number]  DATE OF BIRTH:  1940-12-20  DATE OF ADMISSION:  01/26/2016  PRIMARY CARE PHYSICIAN: Madelyn Brunner, MD   REQUESTING/REFERRING PHYSICIAN: Dr. Cinda Quest  CHIEF COMPLAINT:   Chief Complaint  Patient presents with  . Loss of Consciousness    HISTORY OF PRESENT ILLNESS:  Janice Galloway  is a 75 y.o. female with a known history of Hypertension, , irritable bowel syndrome presents to the emergency room complaining of acute onset of lower abdominal cramping along with vomiting and diarrhea since today morning. Afebrile. No recent antibiotic use. Patient had 5 episodes of large volume diarrhea. Patient was feeling extremely weak and requested daughter be by her side when she went to the restroom. There daughter noticed that patient passed out while having a bowel movement. She held the patient. Patient recovered quickly. No seizures or incontinence. In the emergency room patient tried to get up to go to the bathroom again and passed out recovering in seconds. Patient's EKG is normal. Troponin is normal and is being admitted for recurrent syncope.  PAST MEDICAL HISTORY:   Past Medical History  Diagnosis Date  . Breast cancer ([place]) 2002    Right breast ca with chemo and rad tx. Left breast ca 1995 with rad tx.   . Hypertension   . Arthritis   . Fibromyalgia   . IBS (irritable bowel syndrome)     PAST SURGICAL HISTORY:   Past Surgical History  Procedure Laterality Date  . Mastectomy Right 2002    Breast ca  . Breast excisional biopsy Left 1995    postive for breast ca    SOCIAL HISTORY:   Social History  Substance Use Topics  . Smoking status: Former Research scientist (life sciences)  . Smokeless tobacco: Not on file  . Alcohol Use: No    FAMILY HISTORY:   Family History  Problem Relation Age of Onset  . Hypertension Other     DRUG ALLERGIES:   Allergies  Allergen Reactions   . Hydrocodone     Other reaction(s): Unknown  . Tramadol Nausea Only    REVIEW OF SYSTEMS:   Review of Systems  Constitutional: Positive for malaise/fatigue. Negative for fever, chills and weight loss.  HENT: Negative for hearing loss and nosebleeds.   Eyes: Negative for blurred vision, double vision and pain.  Respiratory: Negative for cough, hemoptysis, sputum production, shortness of breath and wheezing.   Cardiovascular: Negative for chest pain, palpitations, orthopnea and leg swelling.  Gastrointestinal: Positive for nausea, vomiting, abdominal pain and diarrhea. Negative for constipation.  Genitourinary: Negative for dysuria and hematuria.  Musculoskeletal: Negative for myalgias, back pain and falls.  Skin: Negative for rash.  Neurological: Positive for loss of consciousness. Negative for dizziness, tremors, sensory change, speech change, focal weakness, seizures and headaches.  Endo/Heme/Allergies: Does not bruise/bleed easily.  Psychiatric/Behavioral: Negative for depression and memory loss. The patient is not nervous/anxious.     MEDICATIONS AT HOME:   Prior to Admission medications   Medication Sig Start Date End Date Taking? Authorizing Provider  alendronate (FOSAMAX) 70 MG tablet Take 70 mg by mouth once a week. Take on a empty stomach with 6-8 oz of liquid.  Take on Mondays. 12/25/15  Yes Historical Provider, MD  amLODipine (NORVASC) 5 MG tablet Take 5 mg by mouth daily. 01/08/16  Yes Historical Provider, MD  hydrochlorothiazide (HYDRODIURIL) 25 MG tablet Take 25 mg by mouth  daily. 01/08/16  Yes Historical Provider, MD  lisinopril (PRINIVIL,ZESTRIL) 20 MG tablet Take 20 mg by mouth 2 (two) times daily. 01/08/16  Yes Historical Provider, MD  lovastatin (MEVACOR) 20 MG tablet Take 20 mg by mouth at bedtime. 01/08/16  Yes Historical Provider, MD  magnesium oxide (MAG-OX) 400 MG tablet Take 400 mg by mouth daily as needed.   Yes Historical Provider, MD  Olopatadine HCl (PAZEO) 0.7 %  SOLN Place 2 drops into both eyes daily. 07/13/15  Yes Historical Provider, MD  omeprazole (PRILOSEC) 20 MG capsule Take 20 mg by mouth daily. 01/08/16  Yes Historical Provider, MD  potassium chloride (K-DUR) 10 MEQ tablet Take 10 mEq by mouth daily. 01/07/16  Yes Historical Provider, MD  predniSONE (DELTASONE) 5 MG tablet Take 2.5 mg by mouth daily. 01/07/16  Yes Historical Provider, MD  SYNTHROID 75 MCG tablet Take 75 mcg by mouth daily before breakfast. 01/10/16  Yes Historical Provider, MD     VITAL SIGNS:  Blood pressure 124/63, pulse 74, temperature 97.9 F (36.6 C), resp. rate 20, height 5\' 5"  (1.651 m), weight 61.236 kg (135 lb), SpO2 97 %.  PHYSICAL EXAMINATION:  Physical Exam  GENERAL:  75 y.o.-year-old patient lying in the bed with no acute distress.  EYES: Pupils equal, round, reactive to light and accommodation. No scleral icterus. Extraocular muscles intact.  HEENT: Head atraumatic, normocephalic. Oropharynx and nasopharynx clear. No oropharyngeal erythema, moist oral mucosa  NECK:  Supple, no jugular venous distention. No thyroid enlargement, no tenderness.  LUNGS: Normal breath sounds bilaterally, no wheezing, rales, rhonchi. No use of accessory muscles of respiration.  CARDIOVASCULAR: S1, S2 normal. No murmurs, rubs, or gallops.  ABDOMEN: Soft, nontender, nondistended. Bowel sounds present. No organomegaly or mass.  EXTREMITIES: No pedal edema, cyanosis, or clubbing. + 2 pedal & radial pulses b/l.   NEUROLOGIC: Cranial nerves II through XII are intact. No focal Motor or sensory deficits appreciated b/l PSYCHIATRIC: The patient is alert and oriented x 3. Good affect.  SKIN: No obvious rash, lesion, or ulcer.   LABORATORY PANEL:   CBC  Recent Labs Lab 01/26/16 1407  WBC 8.3  HGB 12.6  HCT 38.4  PLT 320   ------------------------------------------------------------------------------------------------------------------  Chemistries   Recent Labs Lab 01/26/16 1407  NA  139  K 3.4*  CL 102  CO2 30  GLUCOSE 149*  BUN 21*  CREATININE 1.18*  CALCIUM 9.2  AST 32  ALT 33  ALKPHOS 76  BILITOT 0.3   ------------------------------------------------------------------------------------------------------------------  Cardiac Enzymes  Recent Labs Lab 01/26/16 1407  TROPONINI <0.03   ------------------------------------------------------------------------------------------------------------------  RADIOLOGY:  Ct Head Wo Contrast  01/26/2016  CLINICAL DATA:  Syncopal episode EXAM: CT HEAD WITHOUT CONTRAST TECHNIQUE: Contiguous axial images were obtained from the base of the skull through the vertex without intravenous contrast. COMPARISON:  01/11/2007 FINDINGS: Brain: Gray-white differentiation is maintained and no CT evidence of acute large territory infarct. No intraparenchymal or extra-axial mass or hemorrhage. Unchanged size and configuration of the ventricles and basilar cisterns. No midline shift. Vascular: Intracranial atherosclerosis Skull: Negative for fracture or focal lesion. Sinuses/Orbits: There is underpneumatization of the left frontal sinus. The remaining paranasal sinuses and mastoid air cells are normally aerated. No air-fluid levels. Other: Regional soft tissues appear normal. IMPRESSION: Negative noncontrast head CT. Electronically Signed   By: Sandi Mariscal M.D.   On: 01/26/2016 15:59     IMPRESSION AND PLAN:   * Syncope This is likely due to dehydration or vasovagal attack. Admit to telemetry.  Repeat troponin. Check echocardiogram. Further management as per test results.  * Vomiting and diarrhea likely from viral gastroenteritis Improving. Patient does have dehydration and will start IV fluids. No fever. Normal WBC count. Mild hypokalemia will be replaced orally.  * HTN Continue home medications  * DVT prophylaxis  Lovenox  All the records are reviewed and case discussed with ED provider. Management plans discussed with the  patient, family and they are in agreement.  CODE STATUS: FULL CODE  TOTAL TIME TAKING CARE OF THIS PATIENT: 40 minutes.   Hillary Bow R M.D on 01/26/2016 at 4:55 PM  Between 7am to 6pm - Pager - [phone]  After 6pm go to [email or web address] - password [email or web address] [facility]  Office  [phone]  CC: Primary care physician; Madelyn Brunner, MD  Note: This dictation was prepared with Dragon dictation along with smaller phrase technology. Any transcriptional errors that result from this process are unintentional.

## 2016-01-26 NOTE — ED Notes (Signed)
Nurse to help pt to the bathroom when patient sits up she becomes unresponsive and starring off. Malinda,MD to bedside.family at bedside  Episode last about 3 mins. Pt urinates on self. Pt cleaned

## 2016-01-27 ENCOUNTER — Observation Stay (HOSPITAL_BASED_OUTPATIENT_CLINIC_OR_DEPARTMENT_OTHER)
Admit: 2016-01-27 | Discharge: 2016-01-27 | Disposition: A | Discharge status: Home / Self Care | Payer: Medicare Other | Attending: Internal Medicine | Admitting: Internal Medicine

## 2016-01-27 DIAGNOSIS — R55 Syncope and collapse: Secondary | ICD-10-CM

## 2016-01-27 LAB — BASIC METABOLIC PANEL
ANION GAP: 5 (ref 5–15)
BUN: 14 mg/dL (ref 6–20)
CALCIUM: 8.4 mg/dL — AB (ref 8.9–10.3)
CO2: 27 mmol/L (ref 22–32)
Chloride: 105 mmol/L (ref 101–111)
Creatinine, Ser: 1.07 mg/dL — ABNORMAL HIGH (ref 0.44–1.00)
GFR, EST AFRICAN AMERICAN: 57 mL/min — AB (ref >60)
GFR, EST NON AFRICAN AMERICAN: 49 mL/min — AB (ref >60)
Glucose, Bld: 151 mg/dL — ABNORMAL HIGH (ref 65–99)
Potassium: 3.7 mmol/L (ref 3.5–5.1)
SODIUM: 137 mmol/L (ref 135–145)

## 2016-01-27 LAB — CBC
HCT: 33.8 % — ABNORMAL LOW (ref 35.0–47.0)
HEMOGLOBIN: 11.3 g/dL — AB (ref 12.0–16.0)
MCH: 27.9 pg (ref 26.0–34.0)
MCHC: 33.3 g/dL (ref 32.0–36.0)
MCV: 83.8 fL (ref 80.0–100.0)
PLATELETS: 287 10*3/uL (ref 150–440)
RBC: 4.03 MIL/uL (ref 3.80–5.20)
RDW: 13.4 % (ref 11.5–14.5)
WBC: 12.8 10*3/uL — AB (ref 3.6–11.0)

## 2016-01-27 LAB — TROPONIN I

## 2016-01-27 LAB — LACTIC ACID, PLASMA: Lactic Acid, Venous: 1.3 mmol/L (ref 0.5–2.0)

## 2016-01-27 LAB — ECHOCARDIOGRAM COMPLETE
HEIGHTINCHES: 65 in
WEIGHTICAEL: 2320 [oz_av]

## 2016-01-27 NOTE — Progress Notes (Signed)
*  PRELIMINARY RESULTS* Echocardiogram 2D Echocardiogram has been performed.  Janice Galloway Stills 01/27/2016, 10:34 AM

## 2016-01-27 NOTE — Progress Notes (Signed)
[facility] [place] NAME: Janice Galloway    MR#:  [id number]  DATE OF BIRTH:  1941/05/13  SUBJECTIVE:  CHIEF COMPLAINT:   Chief Complaint  Patient presents with  . Loss of Consciousness  Just nauseous now, but not having any vomiting or abdominal pain.  Wants to eat  REVIEW OF SYSTEMS:  Review of Systems  Constitutional: Negative for fever, weight loss, malaise/fatigue and diaphoresis.  HENT: Negative for ear discharge, ear pain, hearing loss, nosebleeds, sore throat and tinnitus.   Eyes: Negative for blurred vision and pain.  Respiratory: Negative for cough, hemoptysis, shortness of breath and wheezing.   Cardiovascular: Negative for chest pain, palpitations, orthopnea and leg swelling.  Gastrointestinal: Positive for nausea. Negative for heartburn, vomiting, abdominal pain, diarrhea, constipation and blood in stool.  Genitourinary: Negative for dysuria, urgency and frequency.  Musculoskeletal: Negative for myalgias and back pain.  Skin: Negative for itching and rash.  Neurological: Negative for dizziness, tingling, tremors, focal weakness, seizures, weakness and headaches.  Psychiatric/Behavioral: Negative for depression. The patient is not nervous/anxious.    DRUG ALLERGIES:   Allergies  Allergen Reactions  . Hydrocodone     Other reaction(s): Unknown  . Tramadol Nausea Only   VITALS:  Blood pressure 104/50, pulse 93, temperature 98.3 F (36.8 C), temperature source Oral, resp. rate 18, height 5\' 5"  (1.651 m), weight 65.772 kg (145 lb), SpO2 90 %. PHYSICAL EXAMINATION:  Physical Exam  Constitutional: She is oriented to person, place, and time and well-developed, well-nourished, and in no distress.  HENT:  Head: Normocephalic and atraumatic.  Eyes: Conjunctivae and EOM are normal. Pupils are equal, round, and reactive to light.  Neck: Normal range of motion. Neck supple. No tracheal deviation present. No thyromegaly  present.  Cardiovascular: Normal rate, regular rhythm and normal heart sounds.   Pulmonary/Chest: Effort normal and breath sounds normal. No respiratory distress. She has no wheezes. She exhibits no tenderness.  Abdominal: Soft. Bowel sounds are normal. She exhibits no distension. There is no tenderness.  Musculoskeletal: Normal range of motion.  Neurological: She is alert and oriented to person, place, and time. No cranial nerve deficit.  Skin: Skin is warm and dry. No rash noted.  Psychiatric: Mood and affect normal.   LABORATORY PANEL:   CBC  Recent Labs Lab 01/27/16 0218  WBC 12.8*  HGB 11.3*  HCT 33.8*  PLT 287   ------------------------------------------------------------------------------------------------------------------ Chemistries   Recent Labs Lab 01/26/16 1407 01/26/16 1938 01/27/16 0218  NA 139  --  137  K 3.4*  --  3.7  CL 102  --  105  CO2 30  --  27  GLUCOSE 149*  --  151*  BUN 21*  --  14  CREATININE 1.18*  --  1.07*  CALCIUM 9.2  --  8.4*  MG  --  1.6*  --   AST 32  --   --   ALT 33  --   --   ALKPHOS 76  --   --   BILITOT 0.3  --   --    RADIOLOGY:  Ct Head Wo Contrast  01/26/2016  CLINICAL DATA:  Syncopal episode EXAM: CT HEAD WITHOUT CONTRAST TECHNIQUE: Contiguous axial images were obtained from the base of the skull through the vertex without intravenous contrast. COMPARISON:  01/11/2007 FINDINGS: Brain: Gray-white differentiation is maintained and no CT evidence of acute large territory infarct. No intraparenchymal or extra-axial mass or hemorrhage. Unchanged size and configuration  of the ventricles and basilar cisterns. No midline shift. Vascular: Intracranial atherosclerosis Skull: Negative for fracture or focal lesion. Sinuses/Orbits: There is underpneumatization of the left frontal sinus. The remaining paranasal sinuses and mastoid air cells are normally aerated. No air-fluid levels. Other: Regional soft tissues appear normal. IMPRESSION:  Negative noncontrast head CT. Electronically Signed   By: Sandi Mariscal M.D.   On: 01/26/2016 15:59   Ct Abdomen Pelvis W Contrast  01/26/2016  CLINICAL DATA:  Abdominal pain, nausea, diarrhea, and stomach cramps. Passed out. Possible food poisoning EXAM: CT ABDOMEN AND PELVIS WITH CONTRAST TECHNIQUE: Multidetector CT imaging of the abdomen and pelvis was performed using the standard protocol following bolus administration of intravenous contrast. CONTRAST:  164mL ISOVUE-300 IOPAMIDOL (ISOVUE-300) INJECTION 61% COMPARISON:  09/01/2012 FINDINGS: Atelectasis or consolidation in the medial lung bases bilaterally. Slight fibrosis in the lung bases. Small esophageal hiatal hernia. Calcifications in the spleen, possibly dystrophic or postinflammatory. The liver, gallbladder, pancreas, adrenal glands, kidneys, inferior vena cava, abdominal aorta, and retroperitoneal lymph nodes are unremarkable. Stomach, small bowel, and colon are not abnormally distended. Contrast material flows through to the colon without evidence of obstruction. Distal small bowel demonstrate diffuse wall thickening and fold thickening suggesting enteritis, either infectious or inflammatory. There is a moderate amount of free fluid in the abdomen and pelvis, likely due to ascites. No free air in the abdomen. Pelvis: The appendix is normal. Uterus and ovaries are not enlarged. Bladder wall is not thickened. No pelvic mass or lymphadenopathy. Small bursal fluid collection posterior to the right hip. Degenerative changes in the spine. Spondylolysis with mild spondylolisthesis at L4-5. No destructive bone lesions. IMPRESSION: Diffuse small bowel wall thickening throughout the ileum likely representing enteritis. No evidence of bowel obstruction. Moderate free fluid in the abdomen and pelvis likely ascites. Consolidation or atelectasis in the lung bases. Electronically Signed   By: Lucienne Capers M.D.   On: 01/26/2016 22:10   ASSESSMENT AND PLAN:  *  Syncope - likely due to dehydration or vasovagal attack. - Pending echocardiogram. - monitor on tele  * Vomiting and diarrhea likely from viral gastroenteritis - Improving. - start diet and advance as tolerated - continue IVFs  * Mild hypokalemia - replete and recheck  * HTN Continue home medications  * DVT prophylaxis  Lovenox     All the records are reviewed and case discussed with Care Management/Social Worker. Management plans discussed with the patient, family and they are in agreement.  CODE STATUS: FULL CODE  TOTAL TIME TAKING CARE OF THIS PATIENT: 35 minutes.   More than 50% of the time was spent in counseling/coordination of care: YES  POSSIBLE D/C IN 1-2 DAYS, DEPENDING ON CLINICAL CONDITION.   [facility], Garner Dullea M.D on 01/27/2016 at 1:19 PM  Between 7am to 6pm - Pager - [phone]  After 6pm go to [email or web address] - password [email or web address] [facility]  Office  [phone]  CC: Primary care physician; Madelyn Brunner, MD  Note: This dictation was prepared with Dragon dictation along with smaller phrase technology. Any transcriptional errors that result from this process are unintentional.

## 2016-01-28 DIAGNOSIS — R55 Syncope and collapse: Secondary | ICD-10-CM | POA: Diagnosis not present

## 2016-01-28 MED ORDER — SODIUM CHLORIDE 0.9 % IV BOLUS (SEPSIS): 1000.0000 mL | Freq: Once | INTRAVENOUS | Status: DC

## 2016-01-28 NOTE — Care Management Obs Status (Signed)
[facility] NOTIFICATION   Patient Details  Name: YULEIMY FOLKERS MRN: [id number] Date of Birth: January 02, 1941   Medicare Observation Status Notification Given:  Yes    Shelbie Ammons, RN 01/28/2016, 8:54 AM

## 2016-01-28 NOTE — Discharge Instructions (Signed)
Regular diet  Activity as tolerated  Follow up with Dr. Gilford Rile in 1 week.  Check blood pressure everyday and keep log. Take to Dr. Thomes Dinning office.  Do not take Hydrochlorothiazide till you see Dr. Gilford Rile. Abdominal Pain, Adult Many things can cause abdominal pain. Usually, abdominal pain is not caused by a disease and will improve without treatment. It can often be observed and treated at home. Your health care provider will do a physical exam and possibly order blood tests and X-rays to help determine the seriousness of your pain. However, in many cases, more time must pass before a clear cause of the pain can be found. Before that point, your health care provider may not know if you need more testing or further treatment. HOME CARE INSTRUCTIONS Monitor your abdominal pain for any changes. The following actions may help to alleviate any discomfort you are experiencing:  Only take over-the-counter or prescription medicines as directed by your health care provider.  Do not take laxatives unless directed to do so by your health care provider.  Try a clear liquid diet (broth, tea, or water) as directed by your health care provider. Slowly move to a bland diet as tolerated. SEEK MEDICAL CARE IF:  You have unexplained abdominal pain.  You have abdominal pain associated with nausea or diarrhea.  You have pain when you urinate or have a bowel movement.  You experience abdominal pain that wakes you in the night.  You have abdominal pain that is worsened or improved by eating food.  You have abdominal pain that is worsened with eating fatty foods.  You have a fever. SEEK IMMEDIATE MEDICAL CARE IF:  Your pain does not go away within 2 hours.  You keep throwing up (vomiting).  Your pain is felt only in portions of the abdomen, such as the right side or the left lower portion of the abdomen.  You pass bloody or black tarry stools. MAKE SURE YOU:  Understand these  instructions.  Will watch your condition.  Will get help right away if you are not doing well or get worse.   This information is not intended to replace advice given to you by your health care provider. Make sure you discuss any questions you have with your health care provider.   Document Released: 06/04/2005 Document Revised: 05/16/2015 Document Reviewed: 05/04/2013 Elsevier Interactive Patient Education [employer].

## 2016-01-28 NOTE — Progress Notes (Signed)
Pt for discharge home. Alert. No resp distress. Denies any c/o pain.  No n/v. Sl d/cd/ discharge instructions discussed with pt. No new presc. Home meds discussed. Diet/ activity and f/u discussed.  Verbalize understanding. Home via w/c at this time with son w/o c/o.

## 2016-01-28 NOTE — Care Management (Signed)
Admitted to [facility] under observation status with the diagnosis of syncopy. Lives alone. Son is Montine Circle [phone]). Last seen Dr. Lisette Grinder 3 weeks ago. No home health. No skilled facility. Uses no aids for ambulation. Takes care of all basic and instrumental activities of daily living herself, drives. Son will transport. Shelbie Ammons RN MSN CCM Care Management [phone]

## 2016-01-29 NOTE — Discharge Summary (Signed)
[facility] [place] NAME: Jaela Coller    MR#:  [id number]  DATE OF BIRTH:  [birth year]  DATE OF ADMISSION:  01/26/2016 ADMITTING PHYSICIAN: Hillary Bow, MD  DATE OF DISCHARGE: 01/28/2016 11:45 AM  PRIMARY CARE PHYSICIAN: Madelyn Brunner, MD   ADMISSION DIAGNOSIS:  Abdominal pain [R10.9] Syncope, unspecified syncope type [R55]  DISCHARGE DIAGNOSIS:  Active Problems:   Syncope   SECONDARY DIAGNOSIS:   Past Medical History  Diagnosis Date  . Breast cancer ([place]) 2002    Right breast ca with chemo and rad tx. Left breast ca 1995 with rad tx.   . Hypertension   . Arthritis   . Fibromyalgia   . IBS (irritable bowel syndrome)      ADMITTING HISTORY  Janice Galloway is a 75 y.o. female with a known history of Hypertension, , irritable bowel syndrome presents to the emergency room complaining of acute onset of lower abdominal cramping along with vomiting and diarrhea since today morning. Afebrile. No recent antibiotic use. Patient had 5 episodes of large volume diarrhea. Patient was feeling extremely weak and requested daughter be by her side when she went to the restroom. There daughter noticed that patient passed out while having a bowel movement. She held the patient. Patient recovered quickly. No seizures or incontinence. In the emergency room patient tried to get up to go to the bathroom again and passed out recovering in seconds. Patient's EKG is normal. Troponin is normal and is being admitted for recurrent syncope.  HOSPITAL COURSE:   * Syncope - likely due to dehydration or vasovagal attack. - Echocardiogram showed normal ejection fraction. Nothing acute. - Telemetry showed no arrhythmias or process. Troponin normal.  * Vomiting and diarrhea likely from viral gastroenteritis CT scan showed mild antritis. -Resolved Patient was treated with IV fluids. Palliative discharge her nausea, vomiting and diarrhea have  resolved. No abdominal pain. Tolerated diet.  * Mild hypokalemia Replaced.  * HTN Continue home medications  * DVT prophylaxis  Lovenox  Discharge home in a stable condition.  CONSULTS OBTAINED:     DRUG ALLERGIES:   Allergies  Allergen Reactions  . Hydrocodone     Other reaction(s): Unknown  . Tramadol Nausea Only    DISCHARGE MEDICATIONS:   Discharge Medication List as of 01/28/2016 11:35 AM    CONTINUE these medications which have NOT CHANGED   Details  alendronate (FOSAMAX) 70 MG tablet Take 70 mg by mouth once a week. Take on a empty stomach with 6-8 oz of liquid.  Take on Mondays., Starting 12/25/2015, Until Discontinued, Historical Med    amLODipine (NORVASC) 5 MG tablet Take 5 mg by mouth daily., Starting 01/08/2016, Until Discontinued, Historical Med    hydrochlorothiazide (HYDRODIURIL) 25 MG tablet Take 25 mg by mouth daily., Starting 01/08/2016, Until Discontinued, Historical Med    lisinopril (PRINIVIL,ZESTRIL) 20 MG tablet Take 20 mg by mouth 2 (two) times daily., Starting 01/08/2016, Until Discontinued, Historical Med    lovastatin (MEVACOR) 20 MG tablet Take 20 mg by mouth at bedtime., Starting 01/08/2016, Until Discontinued, Historical Med    magnesium oxide (MAG-OX) 400 MG tablet Take 400 mg by mouth daily as needed., Until Discontinued, Historical Med    Olopatadine HCl (PAZEO) 0.7 % SOLN Place 2 drops into both eyes daily., Starting 07/13/2015, Until Discontinued, Historical Med    omeprazole (PRILOSEC) 20 MG capsule Take 20 mg by mouth daily., Starting 01/08/2016, Until Discontinued, Historical Med  potassium chloride (K-DUR) 10 MEQ tablet Take 10 mEq by mouth daily., Starting 01/07/2016, Until Discontinued, Historical Med    predniSONE (DELTASONE) 5 MG tablet Take 2.5 mg by mouth daily., Starting 01/07/2016, Until Discontinued, Historical Med    SYNTHROID 75 MCG tablet Take 75 mcg by mouth daily before breakfast., Starting 01/10/2016, Until Discontinued,  Historical Med        Today   VITAL SIGNS:  Blood pressure 117/52, pulse 85, temperature 98.1 F (36.7 C), temperature source Oral, resp. rate 20, height 5\' 5"  (1.651 m), weight 64.91 kg (143 lb 1.6 oz), SpO2 94 %.  I/O:  No intake or output data in the 24 hours ending 01/29/16 1532  PHYSICAL EXAMINATION:  Physical Exam  GENERAL:  75 y.o.-year-old patient lying in the bed with no acute distress.  LUNGS: Normal breath sounds bilaterally, no wheezing, rales,rhonchi or crepitation. No use of accessory muscles of respiration.  CARDIOVASCULAR: S1, S2 normal. No murmurs, rubs, or gallops.  ABDOMEN: Soft, non-tender, non-distended. Bowel sounds present. No organomegaly or mass.  NEUROLOGIC: Moves all 4 extremities. PSYCHIATRIC: The patient is alert and oriented x 3.  SKIN: No obvious rash, lesion, or ulcer.   DATA REVIEW:   CBC  Recent Labs Lab 01/27/16 0218  WBC 12.8*  HGB 11.3*  HCT 33.8*  PLT 287    Chemistries   Recent Labs Lab 01/26/16 1407 01/26/16 1938 01/27/16 0218  NA 139  --  137  K 3.4*  --  3.7  CL 102  --  105  CO2 30  --  27  GLUCOSE 149*  --  151*  BUN 21*  --  14  CREATININE 1.18*  --  1.07*  CALCIUM 9.2  --  8.4*  MG  --  1.6*  --   AST 32  --   --   ALT 33  --   --   ALKPHOS 76  --   --   BILITOT 0.3  --   --     Cardiac Enzymes  Recent Labs Lab 01/27/16 0218  TROPONINI <0.03    Microbiology Results  No results found for this or any previous visit.  RADIOLOGY:  No results found.  Follow up with PCP in 1 week.  Management plans discussed with the patient, family and they are in agreement.  CODE STATUS:  Code Status History    Date Active Date Inactive Code Status Order ID Comments User Context   01/26/2016  4:52 PM 01/28/2016  3:03 PM Full Code [id number]  Hillary Bow, MD ED      TOTAL TIME TAKING CARE OF THIS PATIENT ON DAY OF DISCHARGE: more than 30 minutes.   Hillary Bow R M.D on 01/29/2016 at 3:32 PM  Between 7am  to 6pm - Pager - [phone]  After 6pm go to [email or web address] - password [email or web address] [facility]  Office  [phone]  CC: Primary care physician; Madelyn Brunner, MD  Note: This dictation was prepared with Dragon dictation along with smaller phrase technology. Any transcriptional errors that result from this process are unintentional.

## 2016-03-05 ENCOUNTER — Other Ambulatory Visit: Payer: Self-pay | Admitting: Internal Medicine

## 2016-03-05 ENCOUNTER — Ambulatory Visit
Admission: RE | Admit: 2016-03-05 | Discharge: 2016-03-05 | Disposition: A | Discharge status: Home / Self Care | Payer: Medicare Other | Source: Ambulatory Visit | Attending: Internal Medicine | Admitting: Internal Medicine

## 2016-03-05 DIAGNOSIS — Z1231 Encounter for screening mammogram for malignant neoplasm of breast: Secondary | ICD-10-CM | POA: Insufficient documentation

## 2016-09-05 ENCOUNTER — Emergency Department
Admission: EM | Admit: 2016-09-05 | Discharge: 2016-09-05 | Disposition: A | Discharge status: Home / Self Care | Payer: Medicare Other | Attending: Emergency Medicine | Admitting: Emergency Medicine

## 2016-09-05 ENCOUNTER — Encounter: Payer: Self-pay | Admitting: Emergency Medicine

## 2016-09-05 DIAGNOSIS — K529 Noninfective gastroenteritis and colitis, unspecified: Secondary | ICD-10-CM

## 2016-09-05 DIAGNOSIS — Z853 Personal history of malignant neoplasm of breast: Secondary | ICD-10-CM | POA: Diagnosis not present

## 2016-09-05 DIAGNOSIS — I10 Essential (primary) hypertension: Secondary | ICD-10-CM | POA: Insufficient documentation

## 2016-09-05 DIAGNOSIS — Z79899 Other long term (current) drug therapy: Secondary | ICD-10-CM | POA: Diagnosis not present

## 2016-09-05 DIAGNOSIS — Z87891 Personal history of nicotine dependence: Secondary | ICD-10-CM | POA: Insufficient documentation

## 2016-09-05 DIAGNOSIS — R55 Syncope and collapse: Secondary | ICD-10-CM

## 2016-09-05 DIAGNOSIS — R197 Diarrhea, unspecified: Secondary | ICD-10-CM | POA: Diagnosis present

## 2016-09-05 LAB — CBC WITH DIFFERENTIAL/PLATELET
BASOS ABS: 0 10*3/uL (ref 0–0.1)
BASOS PCT: 0 %
EOS ABS: 0.1 10*3/uL (ref 0–0.7)
Eosinophils Relative: 1 %
HEMATOCRIT: 37.3 % (ref 35.0–47.0)
Hemoglobin: 12.4 g/dL (ref 12.0–16.0)
Lymphocytes Relative: 6 %
Lymphs Abs: 0.9 10*3/uL — ABNORMAL LOW (ref 1.0–3.6)
MCH: 27.7 pg (ref 26.0–34.0)
MCHC: 33.4 g/dL (ref 32.0–36.0)
MCV: 83.1 fL (ref 80.0–100.0)
MONO ABS: 0.6 10*3/uL (ref 0.2–0.9)
MONOS PCT: 4 %
NEUTROS ABS: 12.6 10*3/uL — AB (ref 1.4–6.5)
Neutrophils Relative %: 89 %
PLATELETS: 373 10*3/uL (ref 150–440)
RBC: 4.48 MIL/uL (ref 3.80–5.20)
RDW: 13.7 % (ref 11.5–14.5)
WBC: 14.2 10*3/uL — ABNORMAL HIGH (ref 3.6–11.0)

## 2016-09-05 LAB — COMPREHENSIVE METABOLIC PANEL
ALBUMIN: 4 g/dL (ref 3.5–5.0)
ALT: 16 U/L (ref 14–54)
ANION GAP: 10 (ref 5–15)
AST: 20 U/L (ref 15–41)
Alkaline Phosphatase: 79 U/L (ref 38–126)
BUN: 34 mg/dL — AB (ref 6–20)
CHLORIDE: 105 mmol/L (ref 101–111)
CO2: 25 mmol/L (ref 22–32)
Calcium: 9.2 mg/dL (ref 8.9–10.3)
Creatinine, Ser: 2.04 mg/dL — ABNORMAL HIGH (ref 0.44–1.00)
GFR calc Af Amer: 26 mL/min — ABNORMAL LOW (ref >60)
GFR calc non Af Amer: 23 mL/min — ABNORMAL LOW (ref >60)
GLUCOSE: 192 mg/dL — AB (ref 65–99)
POTASSIUM: 2.9 mmol/L — AB (ref 3.5–5.1)
SODIUM: 140 mmol/L (ref 135–145)
TOTAL PROTEIN: 8.2 g/dL — AB (ref 6.5–8.1)
Total Bilirubin: 0.6 mg/dL (ref 0.3–1.2)

## 2016-09-05 MED ORDER — ONDANSETRON HCL 4 MG PO TABS: 4.0000 mg | ORAL_TABLET | Freq: Three times a day (TID) | ORAL | 0 refills | Status: DC | PRN

## 2016-09-05 MED ORDER — POTASSIUM CHLORIDE CRYS ER 20 MEQ PO TBCR
20.0000 meq | EXTENDED_RELEASE_TABLET | Freq: Once | ORAL | Status: AC
  Administered 2016-09-05: 20 meq via ORAL
  Filled 2016-09-05: qty 1

## 2016-09-05 MED ORDER — ONDANSETRON HCL 4 MG/2ML IJ SOLN
4.0000 mg | Freq: Once | INTRAMUSCULAR | Status: AC
  Administered 2016-09-05: 4 mg via INTRAVENOUS
  Filled 2016-09-05: qty 2

## 2016-09-05 MED ORDER — SODIUM CHLORIDE 0.9 % IV BOLUS (SEPSIS)
500.0000 mL | Freq: Once | INTRAVENOUS | Status: AC
  Administered 2016-09-05: 500 mL via INTRAVENOUS

## 2016-09-05 NOTE — Discharge Instructions (Signed)
Please advance diet as tolerated. Return emergency department especially for bloody stool, chest pain, shortness of breath, pass out, increasing abdominal pain, or any other new concerns.   Please return immediately if condition worsens. Please contact her primary physician or the physician you were given for referral. If you have any specialist physicians involved in her treatment and plan please also contact them. Thank you for using [facility] [place] regional emergency Department.

## 2016-09-05 NOTE — ED Triage Notes (Signed)
Pt presents via EMS from home, reports constipation for the past 3 days, took dulcolax yesterday X 2, last night had diarrhea "multiple times".  This am awoke with nausea, vomiting, diarrhea, cramping.  When in bathroom began to feel dizzy, near syncopal episode.

## 2016-09-05 NOTE — ED Provider Notes (Signed)
Time Seen: Approximately 0823  I have reviewed the triage notes  Chief Complaint: Abdominal Pain and Near Syncope   History of Present Illness: Janice Galloway is a 75 y.o. female *who states that she's had constipation over the last 3 days and took some Dulcolax tablets yesterday. She states last night that she had diarrhea and went to the bathroom multiple times. She states she woke up this morning with some nausea, vomiting and some loose watery stool and cramping again. Patient states that she went to the bathroom and felt "" dizzy "". She denies any actual loss of consciousness. She denies any chest pain or shortness of breath. She states right now at this moment she feels better after she vomited this morning. She denies any current nausea, abdominal pain, or any other concerns.   Past Medical History:  Diagnosis Date  . Arthritis   . Breast cancer ([place]) 2002   Right breast ca with chemo and rad tx. Left breast ca 1995 with rad tx.   . Fibromyalgia   . Hypertension   . IBS (irritable bowel syndrome)     Patient Active Problem List   Diagnosis Date Noted  . Syncope 01/26/2016    Past Surgical History:  Procedure Laterality Date  . BREAST EXCISIONAL BIOPSY Left 1995   postive for breast ca. lumpectomy and rad tx  . MASTECTOMY Right 2002   Breast ca    Past Surgical History:  Procedure Laterality Date  . BREAST EXCISIONAL BIOPSY Left 1995   postive for breast ca. lumpectomy and rad tx  . MASTECTOMY Right 2002   Breast ca    Current Outpatient Rx  . Order #: [id number] Class: Historical Med  . Order #: [id number] Class: Historical Med  . Order #: [id number] Class: Historical Med  . Order #: [id number] Class: Historical Med  . Order #: [id number] Class: Historical Med  . Order #: [id number] Class: Historical Med  . Order #: [id number] Class: Historical Med  . Order #: [id number] Class: Historical Med  . Order #: [id number] Class: Historical Med  . Order #: [id number] Class:  Historical Med  . Order #: [id number] Class: Historical Med    Allergies:  Hydrocodone and Tramadol  Family History: Family History  Problem Relation Age of Onset  . Hypertension Other     Social History: Social History  Substance Use Topics  . Smoking status: Former Research scientist (life sciences)  . Smokeless tobacco: Not on file  . Alcohol use No     Review of Systems:   10 point review of systems was performed and was otherwise negative:  Constitutional: No fever Eyes: No visual disturbances ENT: No sore throat, ear pain Cardiac: No chest pain Respiratory: No shortness of breath, wheezing, or stridor Abdomen: No currentabdominal pain, no hematemesis or biliary emesis no melena or hematocheziaEndocrine: No weight loss, No night sweats Extremities: No peripheral edema, cyanosis Skin: No rashes, easy bruising Neurologic: No focal weakness, trouble with speech or swollowing Urologic: No dysuria, Hematuria, or urinary frequency   Physical Exam:  ED Triage Vitals [09/05/16 0823]  Enc Vitals Group     BP 109/66     Pulse Rate 75     Resp 18     Temp 97.7 F (36.5 C)     Temp Source Oral     SpO2 99 %     Weight      Height      Head Circumference      Peak Flow      Pain Score  Pain Loc      Pain Edu?      Excl. in [place]?     General: Awake , Alert , and Oriented times 3; GCS 15 Head: Normal cephalic , atraumatic Eyes: Pupils equal , round, reactive to light Nose/Throat: No nasal drainage, patent upper airway without erythema or exudate.  Neck: Supple, Full range of motion, No anterior adenopathy or palpable thyroid masses Lungs: Clear to ascultation without wheezes , rhonchi, or rales Heart: Regular rate, regular rhythm without murmurs , gallops , or rubs Abdomen: Soft, non tender without rebound, guarding , or rigidity; bowel sounds positive and symmetric in all 4 quadrants. No organomegaly .        Extremities: 2 plus symmetric pulses. No edema, clubbing or  cyanosis Neurologic: normal ambulation, Motor symmetric without deficits, sensory intact Skin: warm, dry, no rashes   Labs:   All laboratory work was reviewed including any pertinent negatives or positives listed below:  Labs Reviewed  CBC WITH DIFFERENTIAL/PLATELET  COMPREHENSIVE METABOLIC PANEL  Patient's white blood cell count slightly elevated   EKG:  ED ECG REPORT I, Daymon Larsen, the attending physician, personally viewed and interpreted this ECG.  Date: 09/05/2016 EKG [id number] Rate: 69 Rhythm: normal sinus rhythm QRS Axis: normal Intervals: normal ST/T Wave abnormalities: normal Conduction Disturbances: none Narrative Interpretation: unremarkable Left atrial enlargement   ED Course:  Patient received IV fluids and Zofran. She feels symptomatically improved at this time. She does not have any chest pain or abdominal pain and repeat exam showed no peritoneal signs etc. I felt the patient most likely had enteritis. She was advised of course to hold the Dulcolax that she had taken and to advance her diet as tolerated. She had a near syncopal episode by description which was likely vagal in nature. Clinical Course      Assessment: * Gastroenteritis Near syncope      Plan: * Outpatient " Discharge Medication List as of 09/05/2016 11:00 AM    START taking these medications   Details  ondansetron (ZOFRAN) 4 MG tablet Take 1 tablet (4 mg total) by mouth every 8 (eight) hours as needed for nausea or vomiting., Starting Fri 09/05/2016, Print      " Patient was advised to return immediately if condition worsens. Patient was advised to follow up with their primary care physician or other specialized physicians involved in their outpatient care. The patient and/or family member/power of attorney had laboratory results reviewed at the bedside. All questions and concerns were addressed and appropriate discharge instructions were distributed by the nursing  staff.             Daymon Larsen, MD 09/05/16 [phone]

## 2017-01-08 DIAGNOSIS — E1122 Type 2 diabetes mellitus with diabetic chronic kidney disease: Secondary | ICD-10-CM | POA: Insufficient documentation

## 2017-01-08 DIAGNOSIS — E119 Type 2 diabetes mellitus without complications: Secondary | ICD-10-CM | POA: Insufficient documentation

## 2017-01-23 ENCOUNTER — Other Ambulatory Visit: Payer: Self-pay | Admitting: Internal Medicine

## 2017-01-23 DIAGNOSIS — Z1231 Encounter for screening mammogram for malignant neoplasm of breast: Secondary | ICD-10-CM

## 2017-03-06 ENCOUNTER — Ambulatory Visit
Admission: RE | Admit: 2017-03-06 | Discharge: 2017-03-06 | Disposition: A | Discharge status: Home / Self Care | Payer: Medicare Other | Source: Ambulatory Visit | Attending: Internal Medicine | Admitting: Internal Medicine

## 2017-03-06 DIAGNOSIS — Z1231 Encounter for screening mammogram for malignant neoplasm of breast: Secondary | ICD-10-CM | POA: Diagnosis present

## 2017-07-14 DIAGNOSIS — N183 Chronic kidney disease, stage 3 unspecified: Secondary | ICD-10-CM | POA: Insufficient documentation

## 2018-01-26 ENCOUNTER — Other Ambulatory Visit: Payer: Self-pay | Admitting: Internal Medicine

## 2018-01-26 DIAGNOSIS — Z1231 Encounter for screening mammogram for malignant neoplasm of breast: Secondary | ICD-10-CM

## 2018-03-18 ENCOUNTER — Ambulatory Visit
Admission: RE | Admit: 2018-03-18 | Discharge: 2018-03-18 | Disposition: A | Discharge status: Home / Self Care | Payer: Medicare Other | Source: Ambulatory Visit | Attending: Internal Medicine | Admitting: Internal Medicine

## 2018-03-18 DIAGNOSIS — Z1231 Encounter for screening mammogram for malignant neoplasm of breast: Secondary | ICD-10-CM | POA: Diagnosis not present

## 2018-03-31 ENCOUNTER — Other Ambulatory Visit: Payer: Self-pay | Admitting: Rheumatology

## 2018-03-31 DIAGNOSIS — M4316 Spondylolisthesis, lumbar region: Secondary | ICD-10-CM

## 2018-03-31 DIAGNOSIS — M5417 Radiculopathy, lumbosacral region: Secondary | ICD-10-CM

## 2018-04-13 ENCOUNTER — Ambulatory Visit
Admission: RE | Admit: 2018-04-13 | Discharge: 2018-04-13 | Disposition: A | Discharge status: Home / Self Care | Payer: Medicare Other | Source: Ambulatory Visit | Attending: Rheumatology | Admitting: Rheumatology

## 2018-04-13 DIAGNOSIS — R600 Localized edema: Secondary | ICD-10-CM | POA: Insufficient documentation

## 2018-04-13 DIAGNOSIS — M5126 Other intervertebral disc displacement, lumbar region: Secondary | ICD-10-CM | POA: Insufficient documentation

## 2018-04-13 DIAGNOSIS — M1288 Other specific arthropathies, not elsewhere classified, other specified site: Secondary | ICD-10-CM | POA: Insufficient documentation

## 2018-04-13 DIAGNOSIS — M48061 Spinal stenosis, lumbar region without neurogenic claudication: Secondary | ICD-10-CM | POA: Insufficient documentation

## 2018-04-13 DIAGNOSIS — M4316 Spondylolisthesis, lumbar region: Secondary | ICD-10-CM

## 2018-04-13 DIAGNOSIS — M5134 Other intervertebral disc degeneration, thoracic region: Secondary | ICD-10-CM | POA: Insufficient documentation

## 2018-04-13 DIAGNOSIS — M5417 Radiculopathy, lumbosacral region: Secondary | ICD-10-CM | POA: Insufficient documentation

## 2018-07-21 ENCOUNTER — Other Ambulatory Visit: Payer: Self-pay

## 2018-07-21 ENCOUNTER — Emergency Department
Admission: EM | Admit: 2018-07-21 | Discharge: 2018-07-21 | Disposition: A | Discharge status: Home / Self Care | Payer: Medicare Other | Attending: Emergency Medicine | Admitting: Emergency Medicine

## 2018-07-21 ENCOUNTER — Encounter: Payer: Self-pay | Admitting: Radiology

## 2018-07-21 ENCOUNTER — Emergency Department: Payer: Medicare Other

## 2018-07-21 DIAGNOSIS — R55 Syncope and collapse: Secondary | ICD-10-CM | POA: Diagnosis not present

## 2018-07-21 DIAGNOSIS — M5432 Sciatica, left side: Secondary | ICD-10-CM | POA: Diagnosis not present

## 2018-07-21 DIAGNOSIS — Z79899 Other long term (current) drug therapy: Secondary | ICD-10-CM | POA: Insufficient documentation

## 2018-07-21 DIAGNOSIS — I10 Essential (primary) hypertension: Secondary | ICD-10-CM | POA: Insufficient documentation

## 2018-07-21 DIAGNOSIS — R188 Other ascites: Secondary | ICD-10-CM

## 2018-07-21 DIAGNOSIS — Z853 Personal history of malignant neoplasm of breast: Secondary | ICD-10-CM | POA: Diagnosis not present

## 2018-07-21 DIAGNOSIS — Z87891 Personal history of nicotine dependence: Secondary | ICD-10-CM | POA: Diagnosis not present

## 2018-07-21 LAB — CBC WITH DIFFERENTIAL/PLATELET
Abs Immature Granulocytes: 0.03 10*3/uL (ref 0.00–0.07)
BASOS ABS: 0.1 10*3/uL (ref 0.0–0.1)
Basophils Relative: 1 %
EOS ABS: 0.1 10*3/uL (ref 0.0–0.5)
Eosinophils Relative: 2 %
HEMATOCRIT: 38.2 % (ref 36.0–46.0)
Hemoglobin: 12.2 g/dL (ref 12.0–15.0)
IMMATURE GRANULOCYTES: 0 %
LYMPHS ABS: 2.2 10*3/uL (ref 0.7–4.0)
LYMPHS PCT: 28 %
MCH: 27.4 pg (ref 26.0–34.0)
MCHC: 31.9 g/dL (ref 30.0–36.0)
MCV: 85.7 fL (ref 80.0–100.0)
Monocytes Absolute: 0.5 10*3/uL (ref 0.1–1.0)
Monocytes Relative: 6 %
NEUTROS ABS: 4.9 10*3/uL (ref 1.7–7.7)
NEUTROS PCT: 63 %
PLATELETS: 362 10*3/uL (ref 150–400)
RBC: 4.46 MIL/uL (ref 3.87–5.11)
RDW: 13.4 % (ref 11.5–15.5)
WBC: 7.8 10*3/uL (ref 4.0–10.5)
nRBC: 0 % (ref 0.0–0.2)

## 2018-07-21 LAB — HEPATIC FUNCTION PANEL
ALT: 16 U/L (ref 0–44)
AST: 21 U/L (ref 15–41)
Albumin: 3.3 g/dL — ABNORMAL LOW (ref 3.5–5.0)
Alkaline Phosphatase: 63 U/L (ref 38–126)
Bilirubin, Direct: 0.1 mg/dL (ref 0.0–0.2)
Indirect Bilirubin: 0.5 mg/dL (ref 0.3–0.9)
Total Bilirubin: 0.6 mg/dL (ref 0.3–1.2)
Total Protein: 6 g/dL — ABNORMAL LOW (ref 6.5–8.1)

## 2018-07-21 LAB — URINALYSIS, COMPLETE (UACMP) WITH MICROSCOPIC
Bilirubin Urine: NEGATIVE
Glucose, UA: NEGATIVE mg/dL
Ketones, ur: NEGATIVE mg/dL
Nitrite: NEGATIVE
Protein, ur: 30 mg/dL — AB
Specific Gravity, Urine: 1.024 (ref 1.005–1.030)
pH: 5 (ref 5.0–8.0)

## 2018-07-21 LAB — BASIC METABOLIC PANEL
Anion gap: 9 (ref 5–15)
BUN: 17 mg/dL (ref 8–23)
CO2: 27 mmol/L (ref 22–32)
Calcium: 9.1 mg/dL (ref 8.9–10.3)
Chloride: 103 mmol/L (ref 98–111)
Creatinine, Ser: 1.6 mg/dL — ABNORMAL HIGH (ref 0.44–1.00)
GFR calc Af Amer: 34 mL/min — ABNORMAL LOW (ref >60)
GFR calc non Af Amer: 30 mL/min — ABNORMAL LOW (ref >60)
GLUCOSE: 162 mg/dL — AB (ref 70–99)
POTASSIUM: 3.2 mmol/L — AB (ref 3.5–5.1)
Sodium: 139 mmol/L (ref 135–145)

## 2018-07-21 LAB — CK: Total CK: 168 U/L (ref 38–234)

## 2018-07-21 LAB — FIBRIN DERIVATIVES D-DIMER (ARMC ONLY): Fibrin derivatives D-dimer (ARMC): >7500 ng/mL (FEU) — ABNORMAL HIGH (ref 0.00–499.00)

## 2018-07-21 LAB — TROPONIN I
Troponin I: <0.03 ng/mL (ref <0.03)
Troponin I: <0.03 ng/mL (ref <0.03)

## 2018-07-21 MED ORDER — CYCLOBENZAPRINE HCL 5 MG PO TABS: 5.0000 mg | ORAL_TABLET | Freq: Three times a day (TID) | ORAL | 0 refills | Status: DC | PRN

## 2018-07-21 MED ORDER — POTASSIUM CHLORIDE CRYS ER 20 MEQ PO TBCR
40.0000 meq | EXTENDED_RELEASE_TABLET | Freq: Once | ORAL | Status: AC
  Administered 2018-07-21: 40 meq via ORAL
  Filled 2018-07-21: qty 2

## 2018-07-21 MED ORDER — SODIUM CHLORIDE 0.9 % IV BOLUS
1000.0000 mL | Freq: Once | INTRAVENOUS | Status: AC
  Administered 2018-07-21: 1000 mL via INTRAVENOUS

## 2018-07-21 MED ORDER — KETOROLAC TROMETHAMINE 30 MG/ML IJ SOLN
30.0000 mg | Freq: Once | INTRAMUSCULAR | Status: AC
  Administered 2018-07-21: 30 mg via INTRAVENOUS
  Filled 2018-07-21: qty 1

## 2018-07-21 MED ORDER — CYCLOBENZAPRINE HCL 10 MG PO TABS
10.0000 mg | ORAL_TABLET | Freq: Once | ORAL | Status: AC
  Administered 2018-07-21: 10 mg via ORAL
  Filled 2018-07-21: qty 1

## 2018-07-21 MED ORDER — IOHEXOL 350 MG/ML SOLN
60.0000 mL | Freq: Once | INTRAVENOUS | Status: AC | PRN
  Administered 2018-07-21: 60 mL via INTRAVENOUS

## 2018-07-21 NOTE — ED Notes (Signed)
Pt up to bathroom.  Gait steady with assistance.  While on toilet, pt stated she felt faint.  Called for help.  Pt had syncopal episode.  She was unable to stand or get herself back in the bed.  Pt lifted by me into bed.  Pt regained consciousness and was alert and oriented X 3.

## 2018-07-21 NOTE — ED Provider Notes (Signed)
  Physical Exam  BP 127/64   Pulse 81   Temp 97.7 F (36.5 C) (Oral)   Resp 16   Ht 5\' 5"  (1.651 m)   Wt 56.7 kg   SpO2 95%   BMI 20.80 kg/m   Physical Exam  ED Course/Procedures     Procedures  MDM  Care assumed at 4 pm. Patient has hx of IBS and previous breast cancer here with near syncope while having some diarrhea. Also has mild L leg sciatica symptoms and saw neurology already and is on physical therapy. Sign out pending reassessment after IVF and CT head and UA.   4:30 pm Patient had another episode of near syncope while doing orthostatics. Repeat EKG unremarkable. Will get delta trop, d-dimer. CT head unremarkable.   6:30 pm Delta trop neg. D-dimer high. Will get CTA chest.  8:13 PM CTA chest showed no PE. Has ascites but patient has known ascites and denies any alcohol use. No abdominal tenderness on exam. LFTs nl. UA unremarkable. Had previous admission for syncope with normal workup. Felt better now. Stable for discharge. Dr. Reita Cliche wanted to prescribe flexeril for L thigh pain vs sciatica. She has follow up with neurology already.       Drenda Freeze, MD 07/21/18 2014

## 2018-07-21 NOTE — ED Notes (Signed)
Pt reminded that a urine sample is needed.  Unable to urinate now.  Call button at hand so she can let me know.

## 2018-07-21 NOTE — ED Notes (Signed)
Pt to CT Scan

## 2018-07-21 NOTE — Discharge Instructions (Signed)
Stay hydrated. You likely have vasovagal syncope   You also have sciatica. Take tylenol for pain. Take flexeril for muscle spasms.   See your neurologist for your back and follow up with your primary care doctor.   You have ascites fluid in your abdomen and you should follow up with your doctor about it.   Consider following with Dr. Clayborn Bigness from cardiology if you have worse dizziness and passing out   Return to ER if you pass out, chest pain, trouble breathing, leg pain, weakness, numbness

## 2018-07-21 NOTE — ED Provider Notes (Signed)
[facility] Emergency Department Provider Note ____________________________________________   I have reviewed the triage vital signs and the triage nursing note.  HISTORY  Chief Complaint Near Syncope   Historian Patient  [name] is a 77 y.o. female brought him by EMS for 2 syncopal events at work.  No full loss of consciousness, but she felt lightheaded and dizzy and then lowered self to the ground.  No history of prior syncope.  She does not report any volume losses vomiting or diarrhea.  No palpitations or chest pain.  No injury.  No headache.  No confusion or altered mental status.       Past Medical History:  Diagnosis Date  . Arthritis   . Breast cancer ([place] 2002   Right breast ca with chemo and rad tx. Left breast ca 1995 with rad tx.   . Fibromyalgia   . Hypertension   . IBS (irritable bowel syndrome)     Patient Active Problem List   Diagnosis Date Noted  . Syncope 01/26/2016    Past Surgical History:  Procedure Laterality Date  . BREAST EXCISIONAL BIOPSY Left 1995   postive for breast ca. lumpectomy and rad tx  . MASTECTOMY Right 2002   Breast ca    Prior to Admission medications   Medication Sig Start Date End Date Taking? Authorizing Provider  alendronate (FOSAMAX) 70 MG tablet Take 70 mg by mouth once a week. Take on a empty stomach with 6-8 oz of liquid.  Take on Mondays. 12/25/15   [provider]  amLODipine (NORVASC) 5 MG tablet Take 5 mg by mouth daily. 01/08/16   [provider]  azelastine (OPTIVAR) 0.05 % ophthalmic solution Place 1 drop into both eyes.    [provider]  hydrochlorothiazide (HYDRODIURIL) 25 MG tablet Take 25 mg by mouth daily. 01/08/16   [provider]  lisinopril (PRINIVIL,ZESTRIL) 20 MG tablet Take 20 mg by mouth 2 (two) times daily. 01/08/16   [provider]  lovastatin (MEVACOR) 20 MG tablet Take 20 mg by mouth at bedtime. 01/08/16    [provider]  magnesium oxide (MAG-OX) 400 MG tablet Take 400 mg by mouth daily as needed.    [provider]  Olopatadine HCl (PAZEO) 0.7 % SOLN Place 2 drops into both eyes daily. 07/13/15   [provider]  omeprazole (PRILOSEC) 20 MG capsule Take 20 mg by mouth daily. 01/08/16   [provider]  ondansetron (ZOFRAN) 4 MG tablet Take 1 tablet (4 mg total) by mouth every 8 (eight) hours as needed for nausea or vomiting. 09/05/16   Daymon Larsen, MD  potassium chloride (K-DUR) 10 MEQ tablet Take 10 mEq by mouth daily. 01/07/16   [provider]  predniSONE (DELTASONE) 5 MG tablet Take 2.5 mg by mouth daily. 01/07/16   [provider]  Probiotic Product (ALIGN PO) Take 1 capsule by mouth daily.    [provider]  SYNTHROID 75 MCG tablet Take 75 mcg by mouth daily before breakfast. 01/10/16   [provider]    Allergies  Allergen Reactions  . Hydrocodone     Other reaction(s): Unknown; pt does not remember reaction  . Tramadol Nausea Only    Family History  Problem Relation Age of Onset  . Hypertension Other   . Breast cancer Neg Hx     Social History Social History   Tobacco Use  . Smoking status: Former Smoker  Substance Use Topics  .  Alcohol use: No    Alcohol/week: 0.0 standard drinks  . Drug use: Not on file    Review of Systems  Constitutional: Negative for fever. Eyes: Negative for visual changes. ENT: Negative for sore throat. Cardiovascular: Negative for chest pain. Respiratory: Negative for shortness of breath. Gastrointestinal: Negative for abdominal pain, vomiting and diarrhea. Genitourinary: Negative for dysuria. Musculoskeletal: Negative for back pain. Skin: Negative for rash. Neurological: Negative for headache.  ____________________________________________   PHYSICAL EXAM:  VITAL SIGNS: ED Triage Vitals  Enc Vitals Group     BP 07/21/18 1223 131/66     Pulse Rate 07/21/18  1223 (!) 59     Resp 07/21/18 1223 16     Temp 07/21/18 1223 97.7 F (36.5 C)     Temp Source 07/21/18 1223 Oral     SpO2 07/21/18 1223 98 %     Weight 07/21/18 1228 125 lb (56.7 kg)     Height 07/21/18 1228 5\' 5"  (1.651 m)     Head Circumference --      Peak Flow --      Pain Score --      Pain Loc --      Pain Edu? --      Excl. in [place]? --      Constitutional: Alert and oriented.  HEENT      Head: Normocephalic and atraumatic.      Eyes: Conjunctivae are normal. Pupils equal and round.       Ears:         Nose: No congestion/rhinnorhea.      Mouth/Throat: Mucous membranes are moist.      Neck: No stridor. Cardiovascular/Chest: Normal rate, regular rhythm.  No murmurs, rubs, or gallops. Respiratory: Normal respiratory effort without tachypnea nor retractions. Breath sounds are clear and equal bilaterally. No wheezes/rales/rhonchi. Gastrointestinal: Soft. No distention, no guarding, no rebound. Nontender.    Genitourinary/rectal:Deferred Musculoskeletal: Nontender with normal range of motion in all extremities. No joint effusions.  No lower extremity tenderness.  No edema. Neurologic:  Normal speech and language. No gross or focal neurologic deficits are appreciated. Skin:  Skin is warm, dry and intact. No rash noted. Psychiatric: Mood and affect are normal. Speech and behavior are normal. Patient exhibits appropriate insight and judgment.   ____________________________________________  LABS (pertinent positives/negatives) I, Lisa Roca, MD the attending physician have reviewed the labs noted below.  Labs Reviewed  BASIC METABOLIC PANEL - Abnormal; Notable for the following components:      Result Value   Potassium 3.2 (*)    Glucose, Bld 162 (*)    Creatinine, Ser 1.60 (*)    GFR calc non Af Amer 30 (*)    GFR calc Af Amer 34 (*)    All other components within normal limits  TROPONIN I  CBC WITH DIFFERENTIAL/PLATELET  URINALYSIS, COMPLETE (UACMP) WITH MICROSCOPIC     ____________________________________________    EKG I, Lisa Roca, MD, the attending physician have personally viewed and interpreted all ECGs.  EKG #1.  60 beats minute.  Normal sinus rhythm.  Narrow QS per normal axis.  Likely LVH.  EKG #2  75 beats from appear normal sinus rhythm.  Narrow QS per normal axis.  No ST and T wave.  QTc 419 ____________________________________________  RADIOLOGY   None __________________________________________  PROCEDURES  Procedure(s) performed: None  Procedures  Critical Care performed: None   ____________________________________________  ED COURSE / ASSESSMENT AND PLAN  Pertinent labs & imaging results that were available during my  care of the patient were reviewed by me and considered in my medical decision making (see chart for details).    Sound like possible orthostatic hypotension leading to near syncope.  Patient was feeling fine here in the ER and got up to go the bathroom and had a syncopal event, patient did have full syncope while here.  She was with the nurse, there is no injury.  Patient felt lightheaded prior to.  No headache now.  No additional highly suspicious cardiac symptoms including no palpitations or chest pain.   Letter was called to patient's bedside because she is complaining of left anterior thigh pain.  She states that she is been told that she has sciatica or pinched nerve coming from her back.  However symptoms today her anterior thigh where she has a very palpable muscle spasm.  Ngetich give her Flexeril for that.  Adding on a head CT given the syncope as well as orthostatics.  Urinalysis is also pending.  Patient care transferred to Dr. Darl Householder at shift change, these above are pending.  Disposition if all negative could include after fluids potentially going home.    CONSULTATIONS:   None   Patient / Family / Caregiver informed of clinical course, medical decision-making process, and agree with  plan.    ___________________________________________   FINAL CLINICAL IMPRESSION(S) / ED DIAGNOSES   Final diagnoses:  Near syncope  Syncope and collapse      ___________________________________________         Note: This dictation was prepared with Dragon dictation. Any transcriptional errors that result from this process are unintentional    Lisa Roca, MD 07/21/18 [phone]

## 2018-07-21 NOTE — ED Triage Notes (Signed)
Per [facility], pt had two syncopal episodes at work today- one while going to the bathroom and one after using the bathroom.  Pt states she did not loss consciousness but lowered herself to the floor to lie down.    Reports loose stool that started today.  VS:  144/75. NSR.  History of syncope.

## 2018-07-21 NOTE — ED Notes (Signed)
Patient transported to CT 

## 2018-09-05 ENCOUNTER — Emergency Department
Admission: EM | Admit: 2018-09-05 | Discharge: 2018-09-05 | Disposition: A | Discharge status: Home / Self Care | Payer: Medicare Other | Attending: Emergency Medicine | Admitting: Emergency Medicine

## 2018-09-05 ENCOUNTER — Other Ambulatory Visit: Payer: Self-pay

## 2018-09-05 ENCOUNTER — Encounter: Payer: Self-pay | Admitting: Emergency Medicine

## 2018-09-05 DIAGNOSIS — Z87891 Personal history of nicotine dependence: Secondary | ICD-10-CM | POA: Insufficient documentation

## 2018-09-05 DIAGNOSIS — I10 Essential (primary) hypertension: Secondary | ICD-10-CM | POA: Insufficient documentation

## 2018-09-05 DIAGNOSIS — Z79899 Other long term (current) drug therapy: Secondary | ICD-10-CM | POA: Diagnosis not present

## 2018-09-05 DIAGNOSIS — B029 Zoster without complications: Secondary | ICD-10-CM | POA: Insufficient documentation

## 2018-09-05 DIAGNOSIS — H5711 Ocular pain, right eye: Secondary | ICD-10-CM | POA: Diagnosis present

## 2018-09-05 MED ORDER — METHYLPREDNISOLONE 4 MG PO TBPK: ORAL_TABLET | ORAL | 0 refills | Status: DC

## 2018-09-05 MED ORDER — FAMCICLOVIR 500 MG PO TABS: 500.0000 mg | ORAL_TABLET | Freq: Three times a day (TID) | ORAL | 0 refills | Status: AC

## 2018-09-05 NOTE — ED Notes (Signed)
Pt states she had sore throat, coughing, horseness, sneezing, runny nose, R eye pain last few days. Today only the R eye pain/HA remain.

## 2018-09-05 NOTE — ED Triage Notes (Signed)
Pt to ED via POV throbbing pain over her right eye x 5-6 days. Pt states that it started with allergy sx's and has now progressed to eye pain. Pt is in NAD .

## 2018-09-05 NOTE — Discharge Instructions (Addendum)
Follow-up with your regular doctor if not better in 3 days.  Start the medication as prescribed.  Return if worsening.  Call your eye doctor for an exam.

## 2018-09-05 NOTE — ED Provider Notes (Signed)
[facility] Emergency Department Provider Note  ____________________________________________   First MD Initiated Contact with Patient 09/05/18 1145     (approximate)  I have reviewed the triage vital signs and the nursing notes.   HISTORY  Chief Complaint Eye Pain    [name] is a 77 y.o. female presents emergency department complaining of a throbbing pain over the right eye.  She states that symptoms have been ongoing for 5 to 6 days but got worse last night.  She did have cold symptoms which have resolved.  She denies any fever or chills at this time.  She denies any change in her vision.    Past Medical History:  Diagnosis Date  . Arthritis   . Breast cancer ([place]) 2002   Right breast ca with chemo and rad tx. Left breast ca 1995 with rad tx.   . Fibromyalgia   . Hypertension   . IBS (irritable bowel syndrome)     Patient Active Problem List   Diagnosis Date Noted  . Syncope 01/26/2016    Past Surgical History:  Procedure Laterality Date  . BREAST EXCISIONAL BIOPSY Left 1995   postive for breast ca. lumpectomy and rad tx  . MASTECTOMY Right 2002   Breast ca    Prior to Admission medications   Medication Sig Start Date End Date Taking? Authorizing Provider  alendronate (FOSAMAX) 70 MG tablet Take 70 mg by mouth once a week. Take on a empty stomach with 6-8 oz of liquid.  Take on Mondays. 12/25/15   [provider]  amLODipine (NORVASC) 5 MG tablet Take 5 mg by mouth daily. 01/08/16   [provider]  azelastine (OPTIVAR) 0.05 % ophthalmic solution Place 1 drop into both eyes.    [provider]  cyclobenzaprine (FLEXERIL) 5 MG tablet Take 1 tablet (5 mg total) by mouth 3 (three) times daily as needed for muscle spasms. 07/21/18   Drenda Freeze, MD  famciclovir (FAMVIR) 500 MG tablet Take 1 tablet (500 mg total) by mouth 3 (three) times daily for 7 days. 09/05/18 09/12/18  Windel Keziah, Linden Dolin, PA-C    hydrochlorothiazide (HYDRODIURIL) 25 MG tablet Take 25 mg by mouth daily. 01/08/16   [provider]  lisinopril (PRINIVIL,ZESTRIL) 20 MG tablet Take 20 mg by mouth 2 (two) times daily. 01/08/16   [provider]  lovastatin (MEVACOR) 20 MG tablet Take 20 mg by mouth at bedtime. 01/08/16   [provider]  magnesium oxide (MAG-OX) 400 MG tablet Take 400 mg by mouth daily as needed.    [provider]  methylPREDNISolone (MEDROL DOSEPAK) 4 MG TBPK tablet Take 6 pills on day one then decrease by 1 pill each day 09/05/18   Versie Starks, PA-C  Olopatadine HCl (PAZEO) 0.7 % SOLN Place 2 drops into both eyes daily. 07/13/15   [provider]  omeprazole (PRILOSEC) 20 MG capsule Take 20 mg by mouth daily. 01/08/16   [provider]  ondansetron (ZOFRAN) 4 MG tablet Take 1 tablet (4 mg total) by mouth every 8 (eight) hours as needed for nausea or vomiting. 09/05/16   Daymon Larsen, MD  potassium chloride (K-DUR) 10 MEQ tablet Take 10 mEq by mouth daily. 01/07/16   [provider]  predniSONE (DELTASONE) 5 MG tablet Take 2.5 mg by mouth daily. 01/07/16   [provider]  Probiotic Product (ALIGN PO) Take 1 capsule by mouth daily.    [provider]  Wilmer Floor  75 MCG tablet Take 75 mcg by mouth daily before breakfast. 01/10/16   [provider]    Allergies Hydrocodone and Tramadol  Family History  Problem Relation Age of Onset  . Hypertension Other   . Breast cancer Neg Hx     Social History Social History   Tobacco Use  . Smoking status: Former Smoker  Substance Use Topics  . Alcohol use: No    Alcohol/week: 0.0 standard drinks  . Drug use: Not on file    Review of Systems  Constitutional: No fever/chills Eyes: No visual changes. ENT: No sore throat.  Throbbing pain above the right eye Respiratory: Denies cough Genitourinary: Negative for dysuria. Musculoskeletal: Negative for back pain. Skin:  Negative for rash.    ____________________________________________   PHYSICAL EXAM:  VITAL SIGNS: ED Triage Vitals  Enc Vitals Group     BP 09/05/18 1112 (!) 195/68     Pulse Rate 09/05/18 1112 74     Resp 09/05/18 1112 20     Temp 09/05/18 1112 98.3 F (36.8 C)     Temp src --      SpO2 09/05/18 1112 100 %     Weight 09/05/18 1113 123 lb (55.8 kg)     Height 09/05/18 1113 5\' 5"  (1.651 m)     Head Circumference --      Peak Flow --      Pain Score 09/05/18 1115 7     Pain Loc --      Pain Edu? --      Excl. in [place]? --     Constitutional: Alert and oriented. Well appearing and in no acute distress. Eyes: Conjunctivae are normal.  Head: Atraumatic.  No rash noted at the frontal sinus Nose: No congestion/rhinnorhea. Mouth/Throat: Mucous membranes are moist.   Neck:  supple no lymphadenopathy noted Cardiovascular: Normal rate, regular rhythm. Heart sounds are normal Respiratory: Normal respiratory effort.  No retractions, lungs c t a  GU: deferred Musculoskeletal: FROM all extremities, warm and well perfused Neurologic:  Normal speech and language.  Skin:  Skin is warm, dry and intact. No rash noted. Psychiatric: Mood and affect are normal. Speech and behavior are normal.  ____________________________________________   LABS (all labs ordered are listed, but only abnormal results are displayed)  Labs Reviewed - No data to display ____________________________________________   ____________________________________________  RADIOLOGY    ____________________________________________   PROCEDURES  Procedure(s) performed: No  Procedures    ____________________________________________   INITIAL IMPRESSION / ASSESSMENT AND PLAN / ED COURSE  Pertinent labs & imaging results that were available during my care of the patient were reviewed by me and considered in my medical decision making (see chart for details).   Patient is 77 year old female presents  emergency department complaining of throbbing pain above the right eye.  No change in her vision.  No history of glaucoma.  Physical exam is unremarkable.  Explained that the throbbing sensation along the dermatome is most likely early shingles.  The patient was given Famvir and Medrol Dosepak.  She is to follow-up with her regular doctor and her eye doctor.  She states she understands will comply.  She discharged stable condition.     As part of my medical decision making, I reviewed the following data within the [place] notes reviewed and incorporated, Old chart reviewed, Notes from prior ED visits and [place] Controlled Substance Database  ____________________________________________   FINAL CLINICAL IMPRESSION(S) / ED DIAGNOSES  Final diagnoses:  Herpes zoster without complication      NEW MEDICATIONS STARTED DURING THIS VISIT:  New Prescriptions   FAMCICLOVIR (FAMVIR) 500 MG TABLET    Take 1 tablet (500 mg total) by mouth 3 (three) times daily for 7 days.   METHYLPREDNISOLONE (MEDROL DOSEPAK) 4 MG TBPK TABLET    Take 6 pills on day one then decrease by 1 pill each day     Note:  This document was prepared using Dragon voice recognition software and may include unintentional dictation errors.    Versie Starks, PA-C 09/05/18 1202    Carrie Mew, MD 09/13/18 [phone]

## 2018-09-13 DIAGNOSIS — B0230 Zoster ocular disease, unspecified: Secondary | ICD-10-CM | POA: Diagnosis not present

## 2018-10-19 DIAGNOSIS — N183 Chronic kidney disease, stage 3 (moderate): Secondary | ICD-10-CM | POA: Diagnosis not present

## 2018-10-19 DIAGNOSIS — N2581 Secondary hyperparathyroidism of renal origin: Secondary | ICD-10-CM | POA: Diagnosis not present

## 2018-10-19 DIAGNOSIS — I129 Hypertensive chronic kidney disease with stage 1 through stage 4 chronic kidney disease, or unspecified chronic kidney disease: Secondary | ICD-10-CM | POA: Diagnosis not present

## 2018-12-09 DIAGNOSIS — R1011 Right upper quadrant pain: Secondary | ICD-10-CM | POA: Diagnosis not present

## 2018-12-09 DIAGNOSIS — R079 Chest pain, unspecified: Secondary | ICD-10-CM | POA: Diagnosis not present

## 2018-12-09 DIAGNOSIS — K219 Gastro-esophageal reflux disease without esophagitis: Secondary | ICD-10-CM | POA: Diagnosis not present

## 2019-01-05 DIAGNOSIS — E034 Atrophy of thyroid (acquired): Secondary | ICD-10-CM | POA: Diagnosis not present

## 2019-01-05 DIAGNOSIS — E7800 Pure hypercholesterolemia, unspecified: Secondary | ICD-10-CM | POA: Diagnosis not present

## 2019-01-05 DIAGNOSIS — I10 Essential (primary) hypertension: Secondary | ICD-10-CM | POA: Diagnosis not present

## 2019-01-05 DIAGNOSIS — E119 Type 2 diabetes mellitus without complications: Secondary | ICD-10-CM | POA: Diagnosis not present

## 2019-01-12 DIAGNOSIS — Z0000 Encounter for general adult medical examination without abnormal findings: Secondary | ICD-10-CM | POA: Diagnosis not present

## 2019-01-12 DIAGNOSIS — I10 Essential (primary) hypertension: Secondary | ICD-10-CM | POA: Diagnosis not present

## 2019-01-12 DIAGNOSIS — M549 Dorsalgia, unspecified: Secondary | ICD-10-CM | POA: Diagnosis not present

## 2019-01-12 DIAGNOSIS — E034 Atrophy of thyroid (acquired): Secondary | ICD-10-CM | POA: Diagnosis not present

## 2019-01-12 DIAGNOSIS — K219 Gastro-esophageal reflux disease without esophagitis: Secondary | ICD-10-CM | POA: Diagnosis not present

## 2019-01-12 DIAGNOSIS — M353 Polymyalgia rheumatica: Secondary | ICD-10-CM | POA: Diagnosis not present

## 2019-01-12 DIAGNOSIS — E119 Type 2 diabetes mellitus without complications: Secondary | ICD-10-CM | POA: Diagnosis not present

## 2019-01-12 DIAGNOSIS — E7800 Pure hypercholesterolemia, unspecified: Secondary | ICD-10-CM | POA: Diagnosis not present

## 2019-01-12 DIAGNOSIS — N183 Chronic kidney disease, stage 3 (moderate): Secondary | ICD-10-CM | POA: Diagnosis not present

## 2019-01-24 DIAGNOSIS — M4726 Other spondylosis with radiculopathy, lumbar region: Secondary | ICD-10-CM | POA: Diagnosis not present

## 2019-01-24 DIAGNOSIS — M5416 Radiculopathy, lumbar region: Secondary | ICD-10-CM | POA: Diagnosis not present

## 2019-01-24 DIAGNOSIS — M5136 Other intervertebral disc degeneration, lumbar region: Secondary | ICD-10-CM | POA: Diagnosis not present

## 2019-02-16 ENCOUNTER — Other Ambulatory Visit: Payer: Self-pay | Admitting: Internal Medicine

## 2019-02-16 DIAGNOSIS — Z1231 Encounter for screening mammogram for malignant neoplasm of breast: Secondary | ICD-10-CM

## 2019-02-21 DIAGNOSIS — I10 Essential (primary) hypertension: Secondary | ICD-10-CM | POA: Diagnosis not present

## 2019-02-24 DIAGNOSIS — I129 Hypertensive chronic kidney disease with stage 1 through stage 4 chronic kidney disease, or unspecified chronic kidney disease: Secondary | ICD-10-CM | POA: Diagnosis not present

## 2019-02-24 DIAGNOSIS — N183 Chronic kidney disease, stage 3 (moderate): Secondary | ICD-10-CM | POA: Diagnosis not present

## 2019-02-25 DIAGNOSIS — Z9013 Acquired absence of bilateral breasts and nipples: Secondary | ICD-10-CM | POA: Diagnosis not present

## 2019-02-25 DIAGNOSIS — C50111 Malignant neoplasm of central portion of right female breast: Secondary | ICD-10-CM | POA: Diagnosis not present

## 2019-02-25 DIAGNOSIS — C50112 Malignant neoplasm of central portion of left female breast: Secondary | ICD-10-CM | POA: Diagnosis not present

## 2019-02-28 DIAGNOSIS — I10 Essential (primary) hypertension: Secondary | ICD-10-CM | POA: Diagnosis not present

## 2019-03-03 DIAGNOSIS — I10 Essential (primary) hypertension: Secondary | ICD-10-CM | POA: Diagnosis not present

## 2019-03-21 DIAGNOSIS — R42 Dizziness and giddiness: Secondary | ICD-10-CM | POA: Diagnosis not present

## 2019-03-21 DIAGNOSIS — S8012XA Contusion of left lower leg, initial encounter: Secondary | ICD-10-CM | POA: Diagnosis not present

## 2019-03-22 ENCOUNTER — Ambulatory Visit
Admission: RE | Admit: 2019-03-22 | Discharge: 2019-03-22 | Disposition: A | Discharge status: Home / Self Care | Payer: Medicare HMO | Source: Ambulatory Visit | Attending: Internal Medicine | Admitting: Internal Medicine

## 2019-03-22 ENCOUNTER — Other Ambulatory Visit: Payer: Self-pay

## 2019-03-22 DIAGNOSIS — Z1231 Encounter for screening mammogram for malignant neoplasm of breast: Secondary | ICD-10-CM | POA: Diagnosis not present

## 2019-03-23 DIAGNOSIS — C50111 Malignant neoplasm of central portion of right female breast: Secondary | ICD-10-CM | POA: Diagnosis not present

## 2019-03-23 DIAGNOSIS — Z9013 Acquired absence of bilateral breasts and nipples: Secondary | ICD-10-CM | POA: Diagnosis not present

## 2019-03-23 DIAGNOSIS — C50112 Malignant neoplasm of central portion of left female breast: Secondary | ICD-10-CM | POA: Diagnosis not present

## 2019-03-28 DIAGNOSIS — N183 Chronic kidney disease, stage 3 (moderate): Secondary | ICD-10-CM | POA: Diagnosis not present

## 2019-03-28 DIAGNOSIS — I129 Hypertensive chronic kidney disease with stage 1 through stage 4 chronic kidney disease, or unspecified chronic kidney disease: Secondary | ICD-10-CM | POA: Diagnosis not present

## 2019-04-13 ENCOUNTER — Other Ambulatory Visit: Payer: Self-pay

## 2019-04-13 ENCOUNTER — Other Ambulatory Visit (INDEPENDENT_AMBULATORY_CARE_PROVIDER_SITE_OTHER): Payer: Self-pay | Admitting: Nephrology

## 2019-04-13 ENCOUNTER — Other Ambulatory Visit (INDEPENDENT_AMBULATORY_CARE_PROVIDER_SITE_OTHER): Payer: Medicare HMO

## 2019-04-13 DIAGNOSIS — I10 Essential (primary) hypertension: Secondary | ICD-10-CM | POA: Diagnosis not present

## 2019-05-09 ENCOUNTER — Encounter (INDEPENDENT_AMBULATORY_CARE_PROVIDER_SITE_OTHER): Payer: Self-pay | Admitting: Vascular Surgery

## 2019-05-09 ENCOUNTER — Ambulatory Visit (INDEPENDENT_AMBULATORY_CARE_PROVIDER_SITE_OTHER): Payer: Medicare HMO | Admitting: Vascular Surgery

## 2019-05-09 ENCOUNTER — Other Ambulatory Visit: Payer: Self-pay

## 2019-05-09 ENCOUNTER — Encounter (INDEPENDENT_AMBULATORY_CARE_PROVIDER_SITE_OTHER): Payer: Self-pay

## 2019-05-09 ENCOUNTER — Telehealth (INDEPENDENT_AMBULATORY_CARE_PROVIDER_SITE_OTHER): Payer: Self-pay

## 2019-05-09 VITALS — BP 222/82 | HR 64 | Resp 16 | Ht 65.0 in | Wt 131.0 lb

## 2019-05-09 DIAGNOSIS — I10 Essential (primary) hypertension: Secondary | ICD-10-CM | POA: Diagnosis not present

## 2019-05-09 DIAGNOSIS — K219 Gastro-esophageal reflux disease without esophagitis: Secondary | ICD-10-CM | POA: Diagnosis not present

## 2019-05-09 DIAGNOSIS — E7800 Pure hypercholesterolemia, unspecified: Secondary | ICD-10-CM | POA: Diagnosis not present

## 2019-05-09 DIAGNOSIS — E119 Type 2 diabetes mellitus without complications: Secondary | ICD-10-CM

## 2019-05-09 NOTE — Progress Notes (Signed)
MRN : [id number]  Janice Galloway is a 78 y.o. ([birth year]) female who presents with chief complaint of  Chief Complaint  Patient presents with   New Patient (Initial Visit)    ref Candiss Norse for renal artery stenosis  .  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 The patient has a long history of hypertension which recently has become increasingly difficult to control utilizing medical therapy. The patient is consistently documented systolic blood pressures over A999333 with diastolic pressures over 90.  Currently she is on 5 antihypertensive medications.  The patient does have family history of hypertension.   There is no prior documented abdominal bruit. The patient occasionally has flushing symptoms but denies palpitations. No episodes of syncope.There is no history of headache. There is no history of flash pulmonary edema.  The patient denies a history of renal disease.  The patient denies amaurosis fugax or recent TIA symptoms. There are no recent neurological changes noted. The patient denies claudication symptoms or rest pain symptoms. The patient denies history of DVT, PE or superficial thrombophlebitis. The patient denies recent episodes of angina or shortness of breath.   Duplex ultrasound of the renal arteries demonstrates <60% bilateral stenosis    Current Meds  Medication Sig   alendronate (FOSAMAX) 70 MG tablet Take 70 mg by mouth once a week. Take on a empty stomach with 6-8 oz of liquid.  Take on Mondays.   cloNIDine (CATAPRES) 0.2 MG tablet    doxazosin (CARDURA) 2 MG tablet    hydrALAZINE (APRESOLINE) 25 MG tablet TK 1 T PO TID   lisinopril (PRINIVIL,ZESTRIL) 20 MG tablet Take 20 mg by mouth 2 (two) times daily.   lovastatin (MEVACOR) 20 MG tablet Take 20 mg by mouth at bedtime.   metoprolol tartrate (LOPRESSOR) 25 MG tablet    omeprazole (PRILOSEC) 20 MG capsule Take 20  mg by mouth daily.   SYNTHROID 75 MCG tablet Take 75 mcg by mouth daily before breakfast.    Past Medical History:  Diagnosis Date   Arthritis    Breast cancer ([place]) 2002   Right breast ca with chemo and rad tx. Left breast ca 1995 with rad tx.    Fibromyalgia    Hypertension    IBS (irritable bowel syndrome)     Past Surgical History:  Procedure Laterality Date   BREAST EXCISIONAL BIOPSY Left 1995   postive for breast ca. lumpectomy and rad tx   MASTECTOMY Right 2002   Breast ca    Social History Social History   Tobacco Use   Smoking status: Former Smoker   Smokeless tobacco: Never Used  Substance Use Topics   Alcohol use: No    Alcohol/week: 0.0 standard drinks   Drug use: Never    Family History Family History  Problem Relation Age of Onset   Hypertension Other    Breast cancer Neg Hx   No family history of bleeding/clotting disorders, porphyria or autoimmune disease   Allergies  Allergen Reactions   Hydrocodone     Other reaction(s): Unknown; pt does not remember reaction   Tramadol Nausea Only     REVIEW OF SYSTEMS (Negative unless checked)  Constitutional: [] Weight loss  [] Fever  [] Chills Cardiac: [] Chest pain   [] Chest pressure   [] Palpitations   [] Shortness of breath when laying flat   [] Shortness of breath with exertion. Vascular:  [] Pain in legs with walking   [] Pain in legs  at rest  [] History of DVT   [] Phlebitis   [] Swelling in legs   [] Varicose veins   [] Non-healing ulcers Pulmonary:   [] Uses home oxygen   [] Productive cough   [] Hemoptysis   [] Wheeze  [] COPD   [] Asthma Neurologic:  [] Dizziness   [] Seizures   [] History of stroke   [] History of TIA  [] Aphasia   [] Vissual changes   [] Weakness or numbness in arm   [] Weakness or numbness in leg Musculoskeletal:   [] Joint swelling   [] Joint pain   [] Low back pain Hematologic:  [] Easy bruising  [] Easy bleeding   [] Hypercoagulable state   [] Anemic Gastrointestinal:  [] Diarrhea    [] Vomiting  [] Gastroesophageal reflux/heartburn   [] Difficulty swallowing. Genitourinary:  [] Chronic kidney disease   [] Difficult urination  [] Frequent urination   [] Blood in urine Skin:  [] Rashes   [] Ulcers  Psychological:  [] History of anxiety   []  History of major depression.  Physical Examination  Vitals:   05/09/19 1311  BP: (!) 222/82  Pulse: 64  Resp: 16  Weight: 131 lb (59.4 kg)  Height: 5\' 5"  (1.651 m)   Body mass index is 21.8 kg/m. Gen: WD/WN, NAD Head: [place]/AT, No temporalis wasting.  Ear/Nose/Throat: Hearing grossly intact, nares w/o erythema or drainage, poor dentition Eyes: PER, EOMI, sclera nonicteric.  Neck: Supple, no masses.  No bruit or JVD.  Pulmonary:  Good air movement, clear to auscultation bilaterally, no use of accessory muscles.  Cardiac: RRR, normal S1, S2, no Murmurs. Vascular:  Vessel Right Left  Radial Palpable Palpable  Gastrointestinal: soft, non-distended. No guarding/no peritoneal signs.  Musculoskeletal: M/S 5/5 throughout.  No deformity or atrophy.  Neurologic: CN 2-12 intact. Pain and light touch intact in extremities.  Symmetrical.  Speech is fluent. Motor exam as listed above. Psychiatric: Judgment intact, Mood & affect appropriate for pt's clinical situation. Dermatologic: No rashes or ulcers noted.  No changes consistent with cellulitis. Lymph : No Cervical lymphadenopathy, no lichenification or skin changes of chronic lymphedema.  CBC Lab Results  Component Value Date   WBC 7.8 07/21/2018   HGB 12.2 07/21/2018   HCT 38.2 07/21/2018   MCV 85.7 07/21/2018   PLT 362 07/21/2018    BMET    Component Value Date/Time   NA 139 07/21/2018 1320   NA 141 08/31/2012 2123   K 3.2 (L) 07/21/2018 1320   K 3.8 08/31/2012 2123   CL 103 07/21/2018 1320   CL 106 08/31/2012 2123   CO2 27 07/21/2018 1320   CO2 28 08/31/2012 2123   GLUCOSE 162 (H) 07/21/2018 1320   GLUCOSE 139 (H) 08/31/2012 2123   BUN 17 07/21/2018 1320   BUN 16 08/31/2012  2123   CREATININE 1.60 (H) 07/21/2018 1320   CREATININE 1.11 08/31/2012 2123   CALCIUM 9.1 07/21/2018 1320   CALCIUM 9.2 08/31/2012 2123   GFRNONAA 30 (L) 07/21/2018 1320   GFRNONAA 50 (L) 08/31/2012 2123   GFRAA 34 (L) 07/21/2018 1320   GFRAA 57 (L) 08/31/2012 2123   CrCl cannot be calculated (Patient's most recent lab result is older than the maximum 21 days allowed.).  COAG No results found for: INR, PROTIME  Radiology Vas US Renal Artery Duplex  Result Date: 04/15/2019 ABDOMINAL VISCERAL High Risk Factors: Hypertension. Performing Technologist: Almira Coaster RVS  Examination Guidelines: A complete evaluation includes B-mode imaging, spectral Doppler, color Doppler, and power Doppler as needed of all accessible portions of each vessel. Bilateral testing is considered an integral part of a complete examination. Limited examinations for  reoccurring indications may be performed as noted.  Duplex Findings: +------------+--------+--------+------+--------+               PSV cm/s EDV cm/s Plaque Comments  +------------+--------+--------+------+--------+  Aorta Prox      55       0                      +------------+--------+--------+------+--------+  Aorta Mid       63       0                      +------------+--------+--------+------+--------+  Aorta Distal    88       0                      +------------+--------+--------+------+--------+  +------------------+--------+--------+-------+  Right Renal Artery PSV cm/s EDV cm/s Comment  +------------------+--------+--------+-------+  Origin                80       17             +------------------+--------+--------+-------+  Proximal              84       13             +------------------+--------+--------+-------+  Mid                   85       12             +------------------+--------+--------+-------+  Distal                68       18             +------------------+--------+--------+-------+ +-----------------+--------+--------+-------+  Left  Renal Artery PSV cm/s EDV cm/s Comment  +-----------------+--------+--------+-------+  Origin               89       20             +-----------------+--------+--------+-------+  Proximal             85       15             +-----------------+--------+--------+-------+  Mid                  76       14             +-----------------+--------+--------+-------+  Distal               93       21             +-----------------+--------+--------+-------+ +------------+--------+--------+--+-----------+--------+--------+---+  Right Kidney PSV cm/s EDV cm/s RI Left Kidney PSV cm/s EDV cm/s RI   +------------+--------+--------+--+-----------+--------+--------+---+  Upper Pole                        Upper Pole                         +------------+--------+--------+--+-----------+--------+--------+---+  Mid                               Mid                                +------------+--------+--------+--+-----------+--------+--------+---+  Moonshine                         +------------+--------+--------+--+-----------+--------+--------+---+  Hilar                             Hilar                              +------------+--------+--------+--+-----------+--------+--------+---+ +------------------+----+------------------+----+  Right Kidney            Left Kidney              +------------------+----+------------------+----+  RAR                     RAR                      +------------------+----+------------------+----+  RAR (manual)       1.01 RAR (manual)       1.00  +------------------+----+------------------+----+  Cortex                  Cortex                   +------------------+----+------------------+----+  Cortex thickness        Corex thickness          +------------------+----+------------------+----+  Kidney length (cm) 8.48 Kidney length (cm) 9.56  +------------------+----+------------------+----+  Summary: Renal:  Right: 1-59% stenosis of the right renal artery. Abnormal  size for        the right kidney. RRV flow present. The Right Kidney appears        to signs of Atrophy (abnormally small). Left:  1-59% stenosis of the left renal artery. Normal size of left        kidney. LRV flow present.  *See table(s) above for measurements and observations.  Diagnosing physician: Leotis Pain MD  Electronically signed by Leotis Pain MD on 04/15/2019 at 12:38:14 PM.    Final      Assessment/Plan 1. Malignant hypertension BP today was not acceptable with systolic reading > A999333 and diastolic reading A999333 while taking 5 medications.  Given that optimal control of the patient's hypertension is important to minimize the risk of heart attack and/or CVA.  Angiography was discussed with the patient, the risks and benefits were reviewed and all questions were answered.  The patient has agreed to proceed with angiography and the intention of intervention.     Arrangements will be made to obtain renal artery angiogram.  The patient will continue the current medications, no changes at this time.  The primary medical service will continue aggressive antihypertensive therapy as per the AHA guidelines   2. Gastroesophageal reflux disease without esophagitis Continue PPI as already ordered, this medication has been reviewed and there are no changes at this time.  Avoidence of caffeine and alcohol  Moderate elevation of the head of the bed   3. Controlled type 2 diabetes mellitus without complication, without long-term current use of insulin (HCC) Continue hypoglycemic medications as already ordered, these medications have been reviewed and there are no changes at this time.  Hgb A1C to be monitored as already arranged by primary service   4. Hypercholesterolemia Continue statin as ordered and  reviewed, no changes at this time     Hortencia Pilar, MD  05/09/2019 1:49 PM

## 2019-05-09 NOTE — Telephone Encounter (Signed)
Patient was seen today in office and is now scheduled for a angio with Dr. Delana Meyer with a 8:00 am arrival time to the [facility]. Patient will do her Covid testing on 05/10/2019 between 12:30-2:30 pm at the [facility]. This information will be mailed to the patient.

## 2019-05-10 ENCOUNTER — Other Ambulatory Visit
Admission: RE | Admit: 2019-05-10 | Discharge: 2019-05-10 | Disposition: A | Discharge status: Home / Self Care | Payer: Medicare HMO | Source: Ambulatory Visit | Attending: Vascular Surgery | Admitting: Vascular Surgery

## 2019-05-10 DIAGNOSIS — I10 Essential (primary) hypertension: Secondary | ICD-10-CM | POA: Diagnosis not present

## 2019-05-10 DIAGNOSIS — Z20828 Contact with and (suspected) exposure to other viral communicable diseases: Secondary | ICD-10-CM | POA: Diagnosis not present

## 2019-05-10 DIAGNOSIS — Z87891 Personal history of nicotine dependence: Secondary | ICD-10-CM | POA: Diagnosis not present

## 2019-05-10 DIAGNOSIS — Z01812 Encounter for preprocedural laboratory examination: Secondary | ICD-10-CM | POA: Insufficient documentation

## 2019-05-10 LAB — SARS CORONAVIRUS 2 (TAT 6-24 HRS): SARS Coronavirus 2: NEGATIVE

## 2019-05-12 ENCOUNTER — Other Ambulatory Visit (INDEPENDENT_AMBULATORY_CARE_PROVIDER_SITE_OTHER): Payer: Self-pay | Admitting: Nurse Practitioner

## 2019-05-13 ENCOUNTER — Other Ambulatory Visit: Payer: Self-pay

## 2019-05-13 ENCOUNTER — Encounter: Payer: Self-pay | Admitting: *Deleted

## 2019-05-13 ENCOUNTER — Encounter
Admission: RE | Disposition: A | Discharge status: Home / Self Care | Payer: Self-pay | Source: Home / Self Care | Attending: Vascular Surgery

## 2019-05-13 ENCOUNTER — Ambulatory Visit
Admission: RE | Admit: 2019-05-13 | Discharge: 2019-05-13 | Disposition: A | Discharge status: Home / Self Care | Payer: Medicare HMO | Attending: Vascular Surgery | Admitting: Vascular Surgery

## 2019-05-13 DIAGNOSIS — K589 Irritable bowel syndrome without diarrhea: Secondary | ICD-10-CM | POA: Insufficient documentation

## 2019-05-13 DIAGNOSIS — M797 Fibromyalgia: Secondary | ICD-10-CM | POA: Diagnosis not present

## 2019-05-13 DIAGNOSIS — Z87891 Personal history of nicotine dependence: Secondary | ICD-10-CM | POA: Diagnosis not present

## 2019-05-13 DIAGNOSIS — Z79899 Other long term (current) drug therapy: Secondary | ICD-10-CM | POA: Insufficient documentation

## 2019-05-13 DIAGNOSIS — K219 Gastro-esophageal reflux disease without esophagitis: Secondary | ICD-10-CM | POA: Diagnosis not present

## 2019-05-13 DIAGNOSIS — Z853 Personal history of malignant neoplasm of breast: Secondary | ICD-10-CM | POA: Insufficient documentation

## 2019-05-13 DIAGNOSIS — Z79890 Hormone replacement therapy (postmenopausal): Secondary | ICD-10-CM | POA: Insufficient documentation

## 2019-05-13 DIAGNOSIS — I10 Essential (primary) hypertension: Secondary | ICD-10-CM | POA: Insufficient documentation

## 2019-05-13 DIAGNOSIS — E119 Type 2 diabetes mellitus without complications: Secondary | ICD-10-CM | POA: Diagnosis not present

## 2019-05-13 DIAGNOSIS — M1990 Unspecified osteoarthritis, unspecified site: Secondary | ICD-10-CM | POA: Insufficient documentation

## 2019-05-13 DIAGNOSIS — E7800 Pure hypercholesterolemia, unspecified: Secondary | ICD-10-CM | POA: Diagnosis not present

## 2019-05-13 HISTORY — PX: RENAL ANGIOGRAPHY: CATH118260

## 2019-05-13 LAB — GLUCOSE, CAPILLARY
Glucose-Capillary: 169 mg/dL — ABNORMAL HIGH (ref 70–99)
Glucose-Capillary: 119 mg/dL — ABNORMAL HIGH (ref 70–99)

## 2019-05-13 LAB — BUN: BUN: 18 mg/dL (ref 8–23)

## 2019-05-13 LAB — CREATININE, SERUM
Creatinine, Ser: 1.52 mg/dL — ABNORMAL HIGH (ref 0.44–1.00)
GFR calc Af Amer: 38 mL/min — ABNORMAL LOW (ref >60)
GFR calc non Af Amer: 32 mL/min — ABNORMAL LOW (ref >60)

## 2019-05-13 SURGERY — RENAL ANGIOGRAPHY
Anesthesia: Moderate Sedation

## 2019-05-13 MED ORDER — HYDROMORPHONE HCL 1 MG/ML IJ SOLN: 1.0000 mg | Freq: Once | INTRAMUSCULAR | Status: DC | PRN

## 2019-05-13 MED ORDER — HEPARIN SODIUM (PORCINE) 1000 UNIT/ML IJ SOLN
INTRAMUSCULAR | Status: AC
  Filled 2019-05-13: qty 1

## 2019-05-13 MED ORDER — SODIUM CHLORIDE 0.9 % IV SOLN
INTRAVENOUS | Status: DC
  Administered 2019-05-13: 08:00:00 via INTRAVENOUS

## 2019-05-13 MED ORDER — MIDAZOLAM HCL 2 MG/ML PO SYRP: 8.0000 mg | ORAL_SOLUTION | Freq: Once | ORAL | Status: DC | PRN

## 2019-05-13 MED ORDER — IODIXANOL 320 MG/ML IV SOLN
INTRAVENOUS | Status: DC | PRN
  Administered 2019-05-13: 09:00:00 25 mL

## 2019-05-13 MED ORDER — MORPHINE SULFATE (PF) 4 MG/ML IV SOLN: 2.0000 mg | INTRAVENOUS | Status: DC | PRN

## 2019-05-13 MED ORDER — CEFAZOLIN SODIUM-DEXTROSE 2-4 GM/100ML-% IV SOLN
2.0000 g | Freq: Once | INTRAVENOUS | Status: AC
  Administered 2019-05-13: 2 g via INTRAVENOUS

## 2019-05-13 MED ORDER — FENTANYL CITRATE (PF) 100 MCG/2ML IJ SOLN
INTRAMUSCULAR | Status: DC | PRN
  Administered 2019-05-13: 50 ug via INTRAVENOUS
  Administered 2019-05-13: 25 ug

## 2019-05-13 MED ORDER — MIDAZOLAM HCL 2 MG/2ML IJ SOLN
INTRAMUSCULAR | Status: DC | PRN
  Administered 2019-05-13: 2 mg via INTRAVENOUS
  Administered 2019-05-13: 09:00:00 1 mg

## 2019-05-13 MED ORDER — DIPHENHYDRAMINE HCL 50 MG/ML IJ SOLN: 50.0000 mg | Freq: Once | INTRAMUSCULAR | Status: DC | PRN

## 2019-05-13 MED ORDER — METHYLPREDNISOLONE SODIUM SUCC 125 MG IJ SOLR: 125.0000 mg | Freq: Once | INTRAMUSCULAR | Status: DC | PRN

## 2019-05-13 MED ORDER — FENTANYL CITRATE (PF) 100 MCG/2ML IJ SOLN
INTRAMUSCULAR | Status: AC
  Administered 2019-05-13: 10:00:00
  Filled 2019-05-13: qty 4

## 2019-05-13 MED ORDER — FAMOTIDINE 20 MG PO TABS: 40.0000 mg | ORAL_TABLET | Freq: Once | ORAL | Status: DC | PRN

## 2019-05-13 MED ORDER — ONDANSETRON HCL 4 MG/2ML IJ SOLN: 4.0000 mg | Freq: Four times a day (QID) | INTRAMUSCULAR | Status: DC | PRN

## 2019-05-13 MED ORDER — MIDAZOLAM HCL 5 MG/5ML IJ SOLN
INTRAMUSCULAR | Status: AC
  Administered 2019-05-13: 1 mg
  Filled 2019-05-13: qty 5

## 2019-05-13 SURGICAL SUPPLY — 13 items
CATH ANGIO 5F PIGTAIL 65CM (CATHETERS) ×2 IMPLANT
CATH VS15FR (CATHETERS) ×2 IMPLANT
DEVICE STARCLOSE SE CLOSURE (Vascular Products) ×2 IMPLANT
DEVICE TORQUE .025-.038 (MISCELLANEOUS) ×2 IMPLANT
GUIDEWIRE ANGLED .035 180CM (WIRE) ×2 IMPLANT
NDL ENTRY 21GA 7CM ECHOTIP (NEEDLE) IMPLANT
NEEDLE ENTRY 21GA 7CM ECHOTIP (NEEDLE) ×3 IMPLANT
PACK ANGIOGRAPHY (CUSTOM PROCEDURE TRAY) ×2 IMPLANT
SET INTRO CAPELLA COAXIAL (SET/KITS/TRAYS/PACK) ×2 IMPLANT
SHEATH BRITE TIP 5FRX11 (SHEATH) ×2 IMPLANT
SYR MEDRAD MARK 7 150ML (SYRINGE) ×2 IMPLANT
TUBING CONTRAST HIGH PRESS 72 (TUBING) ×2 IMPLANT
WIRE J 3MM .035X145CM (WIRE) ×2 IMPLANT

## 2019-05-13 NOTE — H&P (Signed)
[facility] History & Physical Update  The patient was interviewed and re-examined.  The patient's previous History and Physical has been reviewed and is unchanged.  There is no change in the plan of care. We plan to proceed with the scheduled procedure.  Hortencia Pilar, MD  05/13/2019, 8:13 AM

## 2019-05-13 NOTE — Op Note (Signed)
[facility]  Percutaneous Study/Intervention Procedural Note   Date of Surgery: 05/13/2019,9:06 AM  Surgeon:Schnier, Dolores Lory   Pre-operative Diagnosis: Malignant hypertension; suspect renal artery stenosis  Post-operative diagnosis:  Same no evidence of renal artery stenosis  Procedure(s) Performed:  1.  Abdominal aortogram  2.  Selective injection of the right renal artery  3.  Selective injection of the left renal artery  4.  Star close right common femoral   Anesthesia: Conscious sedation was administered by the interventional radiology RN under my direct supervision. IV Versed plus fentanyl were utilized. Continuous ECG, pulse oximetry and blood pressure was monitored throughout the entire procedure.  Conscious sedation was administered for a total of 30 minutes.  Sheath: 5 French Pinnacle retrograde right common femoral  Contrast: 25 cc   Fluoroscopy Time: 2.6 minutes  Indications:  The patient presents with malignant hypertension.  She currently is taking 5 antihypertensives and still does not have reasonable control.  Nephrology has raised concerns regarding renal artery stenosis.  Angiography is being performed with the intention for intervention if indicated.  The risks and benefits as well as alternative therapies for renal artery revascularization are reviewed with the patient all questions are answered the patient agrees to proceed.  The patient is therefore undergoing angiography with the hope for intervention for obtaining control of her hypertension.   Procedure:  Janice Montini Rogersis a 78 y.o. female who was identified and appropriate procedural time out was performed.  The patient was then placed supine on the table and prepped and draped in the usual sterile fashion.  Ultrasound was used to evaluate the right common femoral artery.  It was echolucent and pulsatile indicating it is patent .  An ultrasound image was acquired for the permanent  record.  A micropuncture needle was used to access the right common femoral artery under direct ultrasound guidance.  The microwire was then advanced under fluoroscopic guidance without difficulty followed by the micro-sheath.  A 0.035 J wire was advanced without resistance and a 5Fr sheath was placed.    Pigtail catheter was then advanced to the level of T12 and AP projection of the aorta was obtained.  After review this image the pigtail catheter was exchanged for a V S1.  Using the V S1 catheter and a floppy Glidewire first the right renal artery was selected and hand-injection was used in a magnified imaging to evaluate distal anatomy.  The catheter was then flipped to the left and the left renal artery was selected in a similar fashion and again hand-injection of contrast was used to demonstrate the distal anatomy of the left renal artery.  After review of the images the catheter was removed over wire and an RAO view of the groin was obtained. StarClose device was deployed without difficulty.   Findings:   Aortogram: Abdominal aorta is opacified with a bolus injection of contrast.  There are diffuse atherosclerotic occlusive changes but there are no hemodynamically significant lesions.  Both right and left renal arteries are identified.  There is also rapid filling of the SMA and celiac.  Right renal: Right renal artery is opacified with contrast.  There is no evidence of hemodynamically significant stenosis throughout its course or at the ostia.  Distal vasculature appears normal as well.  No aneurysmal changes noted.  Normal nephrogram identified.  Left renal: Left renal artery is opacified with contrast.  There is no evidence of hemodynamically significant stenosis throughout its course or at the ostia.  Distal vasculature appears normal as well.  No aneurysmal changes noted.  Normal nephrogram identified.  No indication for intervention.  Blood pressure will be managed with aggressive use of  medication   Disposition: Patient was taken to the recovery room in stable condition having tolerated the procedure well.  Belenda Cruise Schnier 05/13/2019,9:06 AM

## 2019-06-06 ENCOUNTER — Other Ambulatory Visit: Payer: Self-pay

## 2019-06-06 ENCOUNTER — Ambulatory Visit (INDEPENDENT_AMBULATORY_CARE_PROVIDER_SITE_OTHER): Payer: Medicare HMO | Admitting: Vascular Surgery

## 2019-06-06 ENCOUNTER — Encounter (INDEPENDENT_AMBULATORY_CARE_PROVIDER_SITE_OTHER): Payer: Self-pay | Admitting: Vascular Surgery

## 2019-06-06 VITALS — BP 189/76 | HR 66 | Resp 10 | Ht 65.0 in | Wt 130.0 lb

## 2019-06-06 DIAGNOSIS — I10 Essential (primary) hypertension: Secondary | ICD-10-CM | POA: Diagnosis not present

## 2019-06-06 DIAGNOSIS — E7800 Pure hypercholesterolemia, unspecified: Secondary | ICD-10-CM

## 2019-06-06 DIAGNOSIS — K219 Gastro-esophageal reflux disease without esophagitis: Secondary | ICD-10-CM | POA: Diagnosis not present

## 2019-06-06 DIAGNOSIS — E119 Type 2 diabetes mellitus without complications: Secondary | ICD-10-CM

## 2019-06-06 NOTE — Progress Notes (Signed)
MRN : [id number]  Janice Galloway is a 78 y.o. (1941-02-05) female who presents with chief complaint of  Chief Complaint  Patient presents with  . Follow-up  .  History of Present Illness:   The patient returns to the office for followup and review status post angiogram. She has no c/o regarding her right groin access site.  There have been no significant changes to the patient's overall health care.  The patient denies amaurosis fugax or recent TIA symptoms. There are no recent neurological changes noted. The patient denies history of DVT, PE or superficial thrombophlebitis. The patient denies recent episodes of angina or shortness of breath.    Current Meds  Medication Sig  . alendronate (FOSAMAX) 70 MG tablet Take 70 mg by mouth every Monday. Take on a empty stomach with 6-8 oz of liquid.  Take on Mondays.  . cloNIDine (CATAPRES) 0.2 MG tablet Take 0.2 mg by mouth 2 (two) times daily.   Marland Kitchen doxazosin (CARDURA) 2 MG tablet Take 2 mg by mouth every evening.   . hydrALAZINE (APRESOLINE) 25 MG tablet Take 25 mg by mouth 3 (three) times daily.   Marland Kitchen levothyroxine (SYNTHROID) 75 MCG tablet Take 75 mcg by mouth daily before breakfast.  . lisinopril (PRINIVIL,ZESTRIL) 20 MG tablet Take 20 mg by mouth 2 (two) times daily.  Marland Kitchen lovastatin (MEVACOR) 20 MG tablet Take 20 mg by mouth daily with supper.   . metoprolol tartrate (LOPRESSOR) 25 MG tablet Take 25 mg by mouth 2 (two) times daily.   Marland Kitchen omeprazole (PRILOSEC) 20 MG capsule Take 20 mg by mouth daily.    Past Medical History:  Diagnosis Date  . Arthritis   . Breast cancer ([place]) 2002   Right breast ca with chemo and rad tx. Left breast ca 1995 with rad tx.   . Fibromyalgia   . Hypertension   . IBS (irritable bowel syndrome)     Past Surgical History:  Procedure Laterality Date  . BREAST EXCISIONAL BIOPSY Left 1995   postive for breast ca. lumpectomy and rad tx  . MASTECTOMY Right 2002   Breast ca  . RENAL ANGIOGRAPHY N/A  05/13/2019   Procedure: RENAL ANGIOGRAPHY;  Surgeon: Katha Cabal, MD;  Location: [facility];  Service: Cardiovascular;  Laterality: N/A;    Social History Social History   Tobacco Use  . Smoking status: Former Research scientist (life sciences)  . Smokeless tobacco: Never Used  Substance Use Topics  . Alcohol use: No    Alcohol/week: 0.0 standard drinks  . Drug use: Never    Family History Family History  Problem Relation Age of Onset  . Hypertension Other   . Breast cancer Neg Hx     Allergies  Allergen Reactions  . Hydrocodone     Other reaction(s): Unknown; pt does not remember reaction  . Tramadol Nausea Only     REVIEW OF SYSTEMS (Negative unless checked)  Constitutional: [] Weight loss  [] Fever  [] Chills Cardiac: [] Chest pain   [] Chest pressure   [] Palpitations   [] Shortness of breath when laying flat   [] Shortness of breath with exertion. Vascular:  [] Pain in legs with walking   [] Pain in legs at rest  [] History of DVT   [] Phlebitis   [] Swelling in legs   [] Varicose veins   [] Non-healing ulcers Pulmonary:   [] Uses home oxygen   [] Productive cough   [] Hemoptysis   [] Wheeze  [] COPD   [] Asthma Neurologic:  [] Dizziness   [] Seizures   [] History of stroke   []   History of TIA  [] Aphasia   [] Vissual changes   [] Weakness or numbness in arm   [] Weakness or numbness in leg Musculoskeletal:   [] Joint swelling   [] Joint pain   [] Low back pain Hematologic:  [] Easy bruising  [] Easy bleeding   [] Hypercoagulable state   [] Anemic Gastrointestinal:  [] Diarrhea   [] Vomiting  [] Gastroesophageal reflux/heartburn   [] Difficulty swallowing. Genitourinary:  [] Chronic kidney disease   [] Difficult urination  [] Frequent urination   [] Blood in urine Skin:  [] Rashes   [] Ulcers  Psychological:  [] History of anxiety   []  History of major depression.  Physical Examination  Vitals:   06/06/19 0936  BP: (!) 189/76  Pulse: 66  Resp: 10  Weight: 130 lb (59 kg)  Height: 5\' 5"  (1.651 m)   Body mass index is  21.63 kg/m. Gen: WD/WN, NAD Head: [place]/AT, No temporalis wasting.  Ear/Nose/Throat: Hearing grossly intact, nares w/o erythema or drainage Eyes: PER, EOMI, sclera nonicteric.  Neck: Supple, no large masses.   Pulmonary:  Good air movement, no audible wheezing bilaterally, no use of accessory muscles.  Cardiac: RRR, no JVD Vascular:  Vessel Right Left  Radial Palpable Palpable  Gastrointestinal: Non-distended. No guarding/no peritoneal signs.  Musculoskeletal: M/S 5/5 throughout.  No deformity or atrophy.  Neurologic: CN 2-12 intact. Symmetrical.  Speech is fluent. Motor exam as listed above. Psychiatric: Judgment intact, Mood & affect appropriate for pt's clinical situation. Dermatologic: No rashes or ulcers noted.  No changes consistent with cellulitis. Lymph : No lichenification or skin changes of chronic lymphedema.  CBC Lab Results  Component Value Date   WBC 7.8 07/21/2018   HGB 12.2 07/21/2018   HCT 38.2 07/21/2018   MCV 85.7 07/21/2018   PLT 362 07/21/2018    BMET    Component Value Date/Time   NA 139 07/21/2018 1320   NA 141 08/31/2012 2123   K 3.2 (L) 07/21/2018 1320   K 3.8 08/31/2012 2123   CL 103 07/21/2018 1320   CL 106 08/31/2012 2123   CO2 27 07/21/2018 1320   CO2 28 08/31/2012 2123   GLUCOSE 162 (H) 07/21/2018 1320   GLUCOSE 139 (H) 08/31/2012 2123   BUN 18 05/13/2019 0716   BUN 16 08/31/2012 2123   CREATININE 1.52 (H) 05/13/2019 0716   CREATININE 1.11 08/31/2012 2123   CALCIUM 9.1 07/21/2018 1320   CALCIUM 9.2 08/31/2012 2123   GFRNONAA 32 (L) 05/13/2019 0716   GFRNONAA 50 (L) 08/31/2012 2123   GFRAA 38 (L) 05/13/2019 0716   GFRAA 57 (L) 08/31/2012 2123   CrCl cannot be calculated (Patient's most recent lab result is older than the maximum 21 days allowed.).  COAG No results found for: INR, PROTIME  Radiology No results found.   Assessment/Plan 1. Malignant hypertension Patient will follow up with Dr Candiss Norse  2. Gastroesophageal reflux  disease without esophagitis Continue PPI as already ordered, this medication has been reviewed and there are no changes at this time.  Avoidence of caffeine and alcohol  Moderate elevation of the head of the bed   3. Controlled type 2 diabetes mellitus without complication, without long-term current use of insulin (HCC) Continue hypoglycemic medications as already ordered, these medications have been reviewed and there are no changes at this time.  Hgb A1C to be monitored as already arranged by primary service   4. Hypercholesterolemia Continue statin as ordered and reviewed, no changes at this time    Hortencia Pilar, MD  06/06/2019 9:53 AM

## 2019-06-07 DIAGNOSIS — N183 Chronic kidney disease, stage 3 (moderate): Secondary | ICD-10-CM | POA: Diagnosis not present

## 2019-06-07 DIAGNOSIS — I129 Hypertensive chronic kidney disease with stage 1 through stage 4 chronic kidney disease, or unspecified chronic kidney disease: Secondary | ICD-10-CM | POA: Diagnosis not present

## 2019-06-07 DIAGNOSIS — I10 Essential (primary) hypertension: Secondary | ICD-10-CM | POA: Diagnosis not present

## 2019-06-07 DIAGNOSIS — E119 Type 2 diabetes mellitus without complications: Secondary | ICD-10-CM | POA: Diagnosis not present

## 2019-06-07 DIAGNOSIS — N189 Chronic kidney disease, unspecified: Secondary | ICD-10-CM | POA: Diagnosis not present

## 2019-06-15 DIAGNOSIS — I10 Essential (primary) hypertension: Secondary | ICD-10-CM | POA: Diagnosis not present

## 2019-06-15 DIAGNOSIS — Z87891 Personal history of nicotine dependence: Secondary | ICD-10-CM | POA: Diagnosis not present

## 2019-07-12 DIAGNOSIS — E119 Type 2 diabetes mellitus without complications: Secondary | ICD-10-CM | POA: Diagnosis not present

## 2019-07-12 DIAGNOSIS — E034 Atrophy of thyroid (acquired): Secondary | ICD-10-CM | POA: Diagnosis not present

## 2019-07-19 DIAGNOSIS — I129 Hypertensive chronic kidney disease with stage 1 through stage 4 chronic kidney disease, or unspecified chronic kidney disease: Secondary | ICD-10-CM | POA: Diagnosis not present

## 2019-07-19 DIAGNOSIS — Z0000 Encounter for general adult medical examination without abnormal findings: Secondary | ICD-10-CM | POA: Diagnosis not present

## 2019-07-19 DIAGNOSIS — N183 Chronic kidney disease, stage 3 unspecified: Secondary | ICD-10-CM | POA: Diagnosis not present

## 2019-07-19 DIAGNOSIS — E034 Atrophy of thyroid (acquired): Secondary | ICD-10-CM | POA: Diagnosis not present

## 2019-07-19 DIAGNOSIS — E1122 Type 2 diabetes mellitus with diabetic chronic kidney disease: Secondary | ICD-10-CM | POA: Diagnosis not present

## 2019-07-19 DIAGNOSIS — Z87891 Personal history of nicotine dependence: Secondary | ICD-10-CM | POA: Diagnosis not present

## 2019-08-06 DIAGNOSIS — R0781 Pleurodynia: Secondary | ICD-10-CM | POA: Diagnosis not present

## 2019-08-06 DIAGNOSIS — S2242XA Multiple fractures of ribs, left side, initial encounter for closed fracture: Secondary | ICD-10-CM | POA: Diagnosis not present

## 2019-09-09 DIAGNOSIS — U071 COVID-19: Secondary | ICD-10-CM

## 2019-09-09 HISTORY — DX: COVID-19: U07.1

## 2019-09-15 DIAGNOSIS — I10 Essential (primary) hypertension: Secondary | ICD-10-CM | POA: Diagnosis not present

## 2019-09-15 DIAGNOSIS — I701 Atherosclerosis of renal artery: Secondary | ICD-10-CM | POA: Diagnosis not present

## 2019-09-15 DIAGNOSIS — N1832 Chronic kidney disease, stage 3b: Secondary | ICD-10-CM | POA: Diagnosis not present

## 2019-09-15 DIAGNOSIS — E119 Type 2 diabetes mellitus without complications: Secondary | ICD-10-CM | POA: Diagnosis not present

## 2019-09-19 ENCOUNTER — Ambulatory Visit (INDEPENDENT_AMBULATORY_CARE_PROVIDER_SITE_OTHER): Payer: Medicare HMO

## 2019-09-19 ENCOUNTER — Other Ambulatory Visit (INDEPENDENT_AMBULATORY_CARE_PROVIDER_SITE_OTHER): Payer: Self-pay | Admitting: Nephrology

## 2019-09-19 ENCOUNTER — Other Ambulatory Visit: Payer: Self-pay

## 2019-09-19 DIAGNOSIS — I701 Atherosclerosis of renal artery: Secondary | ICD-10-CM

## 2019-11-16 DIAGNOSIS — E119 Type 2 diabetes mellitus without complications: Secondary | ICD-10-CM | POA: Diagnosis not present

## 2019-11-16 DIAGNOSIS — N1832 Chronic kidney disease, stage 3b: Secondary | ICD-10-CM | POA: Diagnosis not present

## 2019-11-16 DIAGNOSIS — I10 Essential (primary) hypertension: Secondary | ICD-10-CM | POA: Diagnosis not present

## 2020-01-13 DIAGNOSIS — E034 Atrophy of thyroid (acquired): Secondary | ICD-10-CM | POA: Diagnosis not present

## 2020-01-13 DIAGNOSIS — E119 Type 2 diabetes mellitus without complications: Secondary | ICD-10-CM | POA: Diagnosis not present

## 2020-01-27 DIAGNOSIS — E7800 Pure hypercholesterolemia, unspecified: Secondary | ICD-10-CM | POA: Diagnosis not present

## 2020-01-27 DIAGNOSIS — E034 Atrophy of thyroid (acquired): Secondary | ICD-10-CM | POA: Diagnosis not present

## 2020-01-27 DIAGNOSIS — E1122 Type 2 diabetes mellitus with diabetic chronic kidney disease: Secondary | ICD-10-CM | POA: Diagnosis not present

## 2020-01-27 DIAGNOSIS — M353 Polymyalgia rheumatica: Secondary | ICD-10-CM | POA: Diagnosis not present

## 2020-01-27 DIAGNOSIS — I129 Hypertensive chronic kidney disease with stage 1 through stage 4 chronic kidney disease, or unspecified chronic kidney disease: Secondary | ICD-10-CM | POA: Diagnosis not present

## 2020-01-27 DIAGNOSIS — Z0000 Encounter for general adult medical examination without abnormal findings: Secondary | ICD-10-CM | POA: Diagnosis not present

## 2020-01-27 DIAGNOSIS — N183 Chronic kidney disease, stage 3 unspecified: Secondary | ICD-10-CM | POA: Diagnosis not present

## 2020-01-27 DIAGNOSIS — Z87891 Personal history of nicotine dependence: Secondary | ICD-10-CM | POA: Diagnosis not present

## 2020-02-10 ENCOUNTER — Other Ambulatory Visit: Payer: Self-pay

## 2020-02-10 ENCOUNTER — Emergency Department: Payer: Medicare HMO

## 2020-02-10 ENCOUNTER — Emergency Department
Admission: EM | Admit: 2020-02-10 | Discharge: 2020-02-10 | Disposition: A | Discharge status: Home / Self Care | Payer: Medicare HMO | Attending: Emergency Medicine | Admitting: Emergency Medicine

## 2020-02-10 DIAGNOSIS — N183 Chronic kidney disease, stage 3 unspecified: Secondary | ICD-10-CM | POA: Diagnosis not present

## 2020-02-10 DIAGNOSIS — Z79899 Other long term (current) drug therapy: Secondary | ICD-10-CM | POA: Diagnosis not present

## 2020-02-10 DIAGNOSIS — I129 Hypertensive chronic kidney disease with stage 1 through stage 4 chronic kidney disease, or unspecified chronic kidney disease: Secondary | ICD-10-CM | POA: Diagnosis not present

## 2020-02-10 DIAGNOSIS — I10 Essential (primary) hypertension: Secondary | ICD-10-CM

## 2020-02-10 DIAGNOSIS — R200 Anesthesia of skin: Secondary | ICD-10-CM | POA: Diagnosis not present

## 2020-02-10 DIAGNOSIS — R202 Paresthesia of skin: Secondary | ICD-10-CM | POA: Diagnosis not present

## 2020-02-10 DIAGNOSIS — R9431 Abnormal electrocardiogram [ECG] [EKG]: Secondary | ICD-10-CM | POA: Diagnosis not present

## 2020-02-10 LAB — BASIC METABOLIC PANEL WITH GFR
Anion gap: 9 (ref 5–15)
BUN: 23 mg/dL (ref 8–23)
CO2: 23 mmol/L (ref 22–32)
Calcium: 9.3 mg/dL (ref 8.9–10.3)
Chloride: 106 mmol/L (ref 98–111)
Creatinine, Ser: 1.54 mg/dL — ABNORMAL HIGH (ref 0.44–1.00)
GFR calc Af Amer: 37 mL/min — ABNORMAL LOW
GFR calc non Af Amer: 32 mL/min — ABNORMAL LOW
Glucose, Bld: 145 mg/dL — ABNORMAL HIGH (ref 70–99)
Potassium: 3.9 mmol/L (ref 3.5–5.1)
Sodium: 138 mmol/L (ref 135–145)

## 2020-02-10 LAB — CBC
HCT: 33 % — ABNORMAL LOW (ref 36.0–46.0)
Hemoglobin: 10.7 g/dL — ABNORMAL LOW (ref 12.0–15.0)
MCH: 27.6 pg (ref 26.0–34.0)
MCHC: 32.4 g/dL (ref 30.0–36.0)
MCV: 85.1 fL (ref 80.0–100.0)
Platelets: 233 K/uL (ref 150–400)
RBC: 3.88 MIL/uL (ref 3.87–5.11)
RDW: 14.4 % (ref 11.5–15.5)
WBC: 5.3 K/uL (ref 4.0–10.5)
nRBC: 0 % (ref 0.0–0.2)

## 2020-02-10 MED ORDER — CLONIDINE HCL 0.1 MG PO TABS
0.2000 mg | ORAL_TABLET | Freq: Once | ORAL | Status: AC
  Administered 2020-02-10: 0.2 mg via ORAL
  Filled 2020-02-10: qty 2

## 2020-02-10 MED ORDER — SODIUM CHLORIDE 0.9% FLUSH: 3.0000 mL | Freq: Once | INTRAVENOUS | Status: DC

## 2020-02-10 MED ORDER — LISINOPRIL 10 MG PO TABS
20.0000 mg | ORAL_TABLET | Freq: Once | ORAL | Status: AC
  Administered 2020-02-10: 20 mg via ORAL
  Filled 2020-02-10: qty 2

## 2020-02-10 MED ORDER — DOXAZOSIN MESYLATE 2 MG PO TABS
2.0000 mg | ORAL_TABLET | Freq: Every day | ORAL | Status: DC
  Administered 2020-02-10: 2 mg via ORAL
  Filled 2020-02-10 (×2): qty 1

## 2020-02-10 MED ORDER — HYDRALAZINE HCL 50 MG PO TABS
25.0000 mg | ORAL_TABLET | Freq: Once | ORAL | Status: AC
  Administered 2020-02-10: 25 mg via ORAL
  Filled 2020-02-10: qty 1

## 2020-02-10 MED ORDER — METOPROLOL TARTRATE 5 MG/5ML IV SOLN
5.0000 mg | Freq: Once | INTRAVENOUS | Status: AC
  Administered 2020-02-10: 5 mg via INTRAVENOUS
  Filled 2020-02-10: qty 5

## 2020-02-10 NOTE — Discharge Instructions (Addendum)
Please seek medical attention for any high fevers, chest pain, shortness of breath, change in behavior, persistent vomiting, bloody stool or any other new or concerning symptoms.  

## 2020-02-10 NOTE — ED Triage Notes (Signed)
Pt arrives via POV for occasional numbness to left cheek area x 1 week. PT reports she thinks her glasses may have rubbed her cheek causing the area to tingle. Denies pain and denies numbness on any other area of her face and body. Speech clear, grips equal and strong bilaterally. PT in NAD, A&Ox4.

## 2020-02-10 NOTE — ED Notes (Signed)
ED Provider at bedside. 

## 2020-02-10 NOTE — ED Notes (Signed)
Patient transported to CT 

## 2020-02-10 NOTE — ED Provider Notes (Signed)
[facility] Emergency Department Provider Note   ____________________________________________   I have reviewed the triage vital signs and the nursing notes.   HISTORY  Chief Complaint Numbness   History limited by: Not Limited   [name] is a 79 y.o. female who presents to the emergency department today because of concerns for tingling to her left cheek.  She states that she has had this happen to her couple times over the past week.  It is localized to the left cheek.  Will gradually get better.  At the time my exam she no longer has any tingling.  She denies any associated change in vision or speech.  She denies any weakness or numbness in any of her extremities.  She states today's episode started this morning.  The patient thinks that it might be related to where the rim of her glasses sits on her face and that she sometimes sleeps with them.  She also has history of high blood pressure.  She states when she checked her blood pressure this morning it was normal.  She has not yet taken her evening doses.    Records reviewed. Per medical record review patient has a history of HTN  Past Medical History:  Diagnosis Date  . Arthritis   . Breast cancer ([place]) 2002   Right breast ca with chemo and rad tx. Left breast ca 1995 with rad tx.   . Fibromyalgia   . Hypertension   . IBS (irritable bowel syndrome)     Patient Active Problem List   Diagnosis Date Noted  . Malignant hypertension 05/09/2019  . CRF (chronic renal failure), stage 3 (moderate) 07/14/2017  . Controlled type 2 diabetes mellitus without complication, without long-term current use of insulin ([place]) 01/08/2017  . Syncope 01/26/2016  . Benign essential hypertension 06/19/2014  . Hypercholesterolemia 06/19/2014  . Hypothyroidism due to acquired atrophy of thyroid 06/19/2014  . PMR (polymyalgia rheumatica) (HCC) 06/19/2014  . GERD (gastroesophageal reflux disease) 12/08/2013     Past Surgical History:  Procedure Laterality Date  . BREAST EXCISIONAL BIOPSY Left 1995   postive for breast ca. lumpectomy and rad tx  . MASTECTOMY Right 2002   Breast ca  . RENAL ANGIOGRAPHY N/A 05/13/2019   Procedure: RENAL ANGIOGRAPHY;  Surgeon: Katha Cabal, MD;  Location: [facility];  Service: Cardiovascular;  Laterality: N/A;    Prior to Admission medications   Medication Sig Start Date End Date Taking? Authorizing Provider  alendronate (FOSAMAX) 70 MG tablet Take 70 mg by mouth every Monday. Take on a empty stomach with 6-8 oz of liquid.  Take on Mondays. 12/25/15   [provider]  cloNIDine (CATAPRES) 0.2 MG tablet Take 0.2 mg by mouth 2 (two) times daily.  03/04/19   [provider]  doxazosin (CARDURA) 2 MG tablet Take 2 mg by mouth every evening.  02/21/19   [provider]  hydrALAZINE (APRESOLINE) 25 MG tablet Take 25 mg by mouth 3 (three) times daily.  03/28/19   [provider]  levothyroxine (SYNTHROID) 75 MCG tablet Take 75 mcg by mouth daily before breakfast.    [provider]  lisinopril (PRINIVIL,ZESTRIL) 20 MG tablet Take 20 mg by mouth 2 (two) times daily. 01/08/16   [provider]  lovastatin (MEVACOR) 20 MG tablet Take 20 mg by mouth daily with supper.  01/08/16   [provider]  metoprolol tartrate (LOPRESSOR) 25 MG tablet Take 25 mg by mouth 2 (two)  times daily.  04/09/19   [provider]  omeprazole (PRILOSEC) 20 MG capsule Take 20 mg by mouth daily. 01/08/16   [provider]    Allergies Hydrocodone and Tramadol  Family History  Problem Relation Age of Onset  . Hypertension Other   . Breast cancer Neg Hx     Social History Social History   Tobacco Use  . Smoking status: Former Research scientist (life sciences)  . Smokeless tobacco: Never Used  Substance Use Topics  . Alcohol use: No    Alcohol/week: 0.0 standard drinks  . Drug use: Never    Review of Systems Constitutional: No  fever/chills Eyes: No visual changes. ENT: No sore throat. Cardiovascular: Denies chest pain. Respiratory: Denies shortness of breath. Gastrointestinal: No abdominal pain.  No nausea, no vomiting.  No diarrhea.   Genitourinary: Negative for dysuria. Musculoskeletal: Negative for back pain. Skin: Negative for rash. Neurological: Positive for tingling to left cheek. ____________________________________________   PHYSICAL EXAM:  VITAL SIGNS: ED Triage Vitals  Enc Vitals Group     BP 02/10/20 1531 (!) 193/55     Pulse Rate 02/10/20 1531 (!) 56     Resp 02/10/20 1531 18     Temp 02/10/20 1531 98 F (36.7 C)     Temp Source 02/10/20 1531 Oral     SpO2 02/10/20 1531 100 %     Weight 02/10/20 1530 130 lb (59 kg)     Height 02/10/20 1530 5\' 5"  (1.651 m)     Head Circumference --      Peak Flow --      Pain Score 02/10/20 1534 0   Constitutional: Alert and oriented.  Eyes: Conjunctivae are normal.  ENT      Head: Normocephalic and atraumatic.      Nose: No congestion/rhinnorhea.      Mouth/Throat: Mucous membranes are moist.      Neck: No stridor. Hematological/Lymphatic/Immunilogical: No cervical lymphadenopathy. Cardiovascular: Normal rate, regular rhythm.  No murmurs, rubs, or gallops.  Respiratory: Normal respiratory effort without tachypnea nor retractions. Breath sounds are clear and equal bilaterally. No wheezes/rales/rhonchi. Gastrointestinal: Soft and non tender. No rebound. No guarding.  Genitourinary: Deferred Musculoskeletal: Normal range of motion in all extremities. No lower extremity edema. Neurologic:  Normal speech and language. No gross focal neurologic deficits are appreciated.  Skin:  Skin is warm, dry and intact. No rash noted. Psychiatric: Mood and affect are normal. Speech and behavior are normal. Patient exhibits appropriate insight and judgment.  ____________________________________________    LABS (pertinent positives/negatives)  CBC wbc 5.3, hgb  10.7, plt 233 BMP wnl except glu 145, cr 1.54  ____________________________________________   EKG  I, Nance Pear, attending physician, personally viewed and interpreted this EKG  EKG Time: 1534 Rate: 57 Rhythm: sinus bradycardia Axis: normal Intervals: qtc 393 QRS: narrow ST changes: no st elevation Impression: abnormal ekg   ____________________________________________    RADIOLOGY  CT head No acute intracranial findings  ____________________________________________   PROCEDURES  Procedures  ____________________________________________   INITIAL IMPRESSION / ASSESSMENT AND PLAN / ED COURSE  Pertinent labs & imaging results that were available during my care of the patient were reviewed by me and considered in my medical decision making (see chart for details).   Patient presented to the ER because of concern for tingling to her left cheek and high blood pressure. The patient has had the tingling a few times over the past week. On exam no neuro deficits. Head ct without acute findings. Patient blood pressure  was elevated. She was given her home medications and pressure improved. Patient without tingling at time of discharge. Discussed with patient importance of follow up with PCP. Also discussed findings of narrowed cervical spine on CT.   ________________________________________   FINAL CLINICAL IMPRESSION(S) / ED DIAGNOSES  Final diagnoses:  Hypertension, unspecified type  Paresthesia     Note: This dictation was prepared with Dragon dictation. Any transcriptional errors that result from this process are unintentional     Nance Pear, MD 02/11/20 1523

## 2020-02-10 NOTE — ED Notes (Signed)
Patient c/o intermittent tingling to left face/cheek.

## 2020-02-10 NOTE — ED Notes (Signed)
Reviewed discharge instructions and follow-up care with patient. Patient verbalized understanding of all information reviewed. Patient stable, with no distress noted at this time.    

## 2020-02-16 DIAGNOSIS — R202 Paresthesia of skin: Secondary | ICD-10-CM | POA: Diagnosis not present

## 2020-02-16 DIAGNOSIS — I10 Essential (primary) hypertension: Secondary | ICD-10-CM | POA: Diagnosis not present

## 2020-02-20 DIAGNOSIS — I10 Essential (primary) hypertension: Secondary | ICD-10-CM | POA: Diagnosis not present

## 2020-02-21 ENCOUNTER — Other Ambulatory Visit: Payer: Self-pay | Admitting: Internal Medicine

## 2020-02-21 DIAGNOSIS — Z1231 Encounter for screening mammogram for malignant neoplasm of breast: Secondary | ICD-10-CM

## 2020-03-14 DIAGNOSIS — I10 Essential (primary) hypertension: Secondary | ICD-10-CM | POA: Diagnosis not present

## 2020-03-14 DIAGNOSIS — N189 Chronic kidney disease, unspecified: Secondary | ICD-10-CM | POA: Diagnosis not present

## 2020-03-14 DIAGNOSIS — E119 Type 2 diabetes mellitus without complications: Secondary | ICD-10-CM | POA: Diagnosis not present

## 2020-03-14 DIAGNOSIS — I129 Hypertensive chronic kidney disease with stage 1 through stage 4 chronic kidney disease, or unspecified chronic kidney disease: Secondary | ICD-10-CM | POA: Diagnosis not present

## 2020-03-14 DIAGNOSIS — N1832 Chronic kidney disease, stage 3b: Secondary | ICD-10-CM | POA: Diagnosis not present

## 2020-03-14 DIAGNOSIS — I701 Atherosclerosis of renal artery: Secondary | ICD-10-CM | POA: Diagnosis not present

## 2020-03-19 DIAGNOSIS — I10 Essential (primary) hypertension: Secondary | ICD-10-CM | POA: Diagnosis not present

## 2020-03-19 DIAGNOSIS — N1832 Chronic kidney disease, stage 3b: Secondary | ICD-10-CM | POA: Diagnosis not present

## 2020-03-19 DIAGNOSIS — E1122 Type 2 diabetes mellitus with diabetic chronic kidney disease: Secondary | ICD-10-CM | POA: Diagnosis not present

## 2020-03-22 ENCOUNTER — Ambulatory Visit
Admission: RE | Admit: 2020-03-22 | Discharge: 2020-03-22 | Disposition: A | Discharge status: Home / Self Care | Payer: Medicare HMO | Source: Ambulatory Visit | Attending: Internal Medicine | Admitting: Internal Medicine

## 2020-03-22 DIAGNOSIS — Z1231 Encounter for screening mammogram for malignant neoplasm of breast: Secondary | ICD-10-CM | POA: Insufficient documentation

## 2020-08-06 DIAGNOSIS — E119 Type 2 diabetes mellitus without complications: Secondary | ICD-10-CM | POA: Diagnosis not present

## 2020-08-06 DIAGNOSIS — E034 Atrophy of thyroid (acquired): Secondary | ICD-10-CM | POA: Diagnosis not present

## 2020-08-08 DIAGNOSIS — Z0001 Encounter for general adult medical examination with abnormal findings: Secondary | ICD-10-CM | POA: Diagnosis not present

## 2020-08-08 DIAGNOSIS — E034 Atrophy of thyroid (acquired): Secondary | ICD-10-CM | POA: Diagnosis not present

## 2020-08-08 DIAGNOSIS — I129 Hypertensive chronic kidney disease with stage 1 through stage 4 chronic kidney disease, or unspecified chronic kidney disease: Secondary | ICD-10-CM | POA: Diagnosis not present

## 2020-08-08 DIAGNOSIS — E1140 Type 2 diabetes mellitus with diabetic neuropathy, unspecified: Secondary | ICD-10-CM | POA: Diagnosis not present

## 2020-08-08 DIAGNOSIS — N183 Chronic kidney disease, stage 3 unspecified: Secondary | ICD-10-CM | POA: Diagnosis not present

## 2020-08-08 DIAGNOSIS — E1122 Type 2 diabetes mellitus with diabetic chronic kidney disease: Secondary | ICD-10-CM | POA: Diagnosis not present

## 2020-09-30 DIAGNOSIS — J0100 Acute maxillary sinusitis, unspecified: Secondary | ICD-10-CM | POA: Diagnosis not present

## 2020-10-15 DIAGNOSIS — N1832 Chronic kidney disease, stage 3b: Secondary | ICD-10-CM | POA: Diagnosis not present

## 2020-10-15 DIAGNOSIS — R8290 Unspecified abnormal findings in urine: Secondary | ICD-10-CM | POA: Diagnosis not present

## 2020-10-22 DIAGNOSIS — N1832 Chronic kidney disease, stage 3b: Secondary | ICD-10-CM | POA: Diagnosis not present

## 2020-10-22 DIAGNOSIS — E1122 Type 2 diabetes mellitus with diabetic chronic kidney disease: Secondary | ICD-10-CM | POA: Diagnosis not present

## 2020-10-22 DIAGNOSIS — I10 Essential (primary) hypertension: Secondary | ICD-10-CM | POA: Diagnosis not present

## 2020-10-24 IMAGING — CT CT HEAD W/O CM
3 series · 15 of 45 positions shown, 18 images · non-contrast
Comparison: 07/21/2018

CLINICAL DATA: LEFT facial numbness for 1 week

EXAM:
CT HEAD WITHOUT CONTRAST
TECHNIQUE: Contiguous axial images were obtained from the base of the skull
through the vertex without intravenous contrast. Sagittal and
coronal MPR images reconstructed from axial data set.

[Series 3: head wo · axial · 0.41mm/px · z∈[-125,-10]mm · 9 of 28 slices shown, 12 images]
[im 3/28  brain]
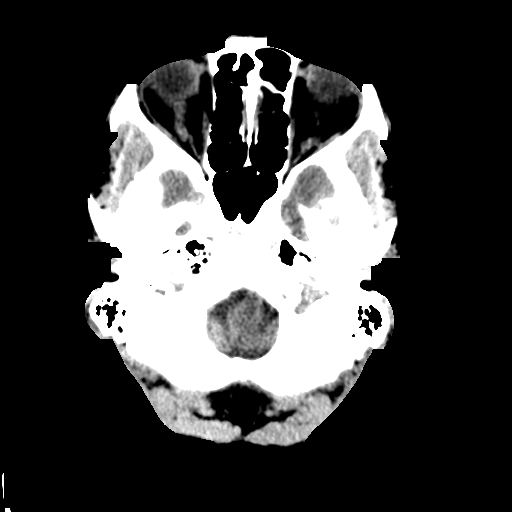
[im 3/28  bone]
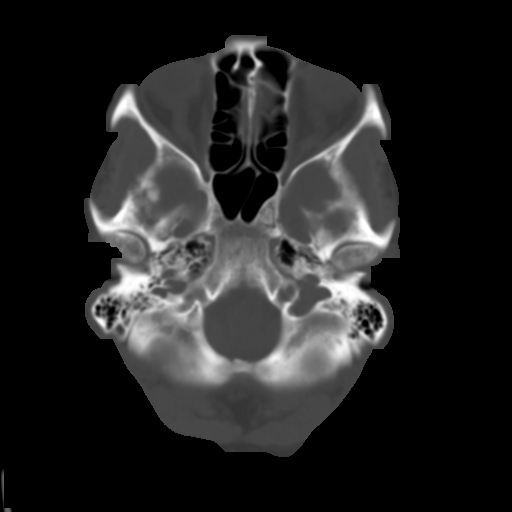
[im 6/28  brain]
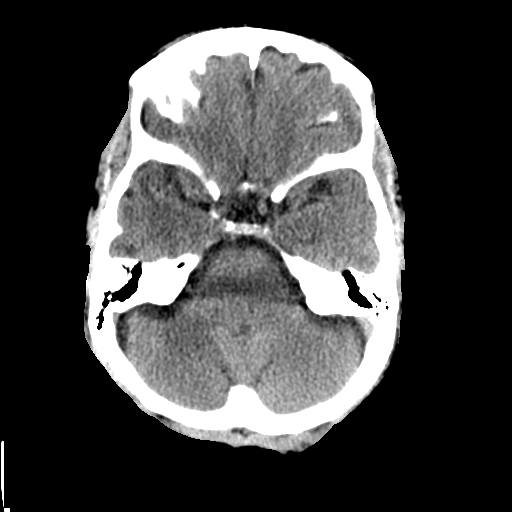
[im 9/28  brain]
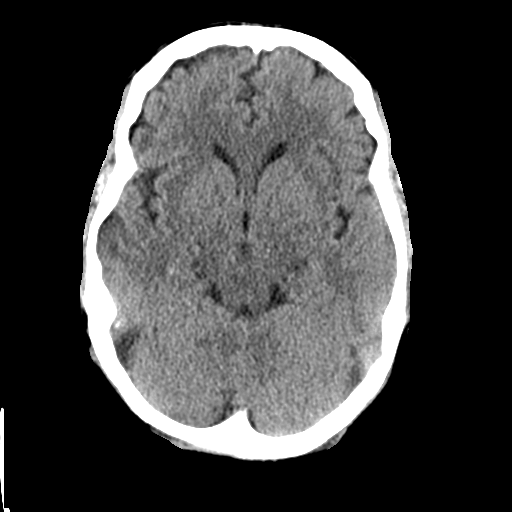
[im 12/28  brain]
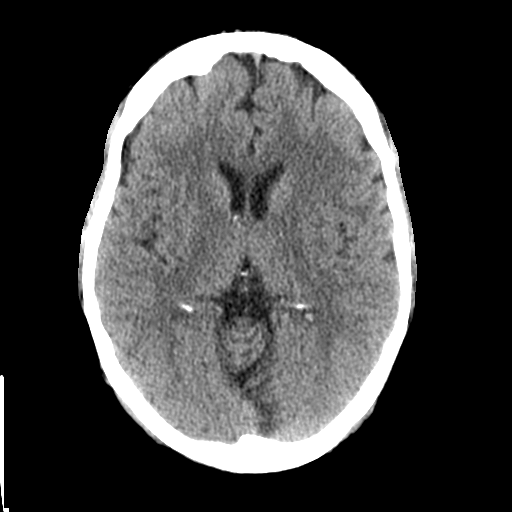
[im 15/28  brain]
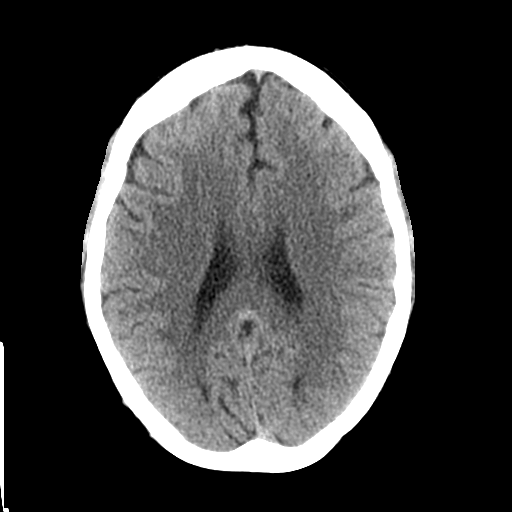
[im 15/28  bone]
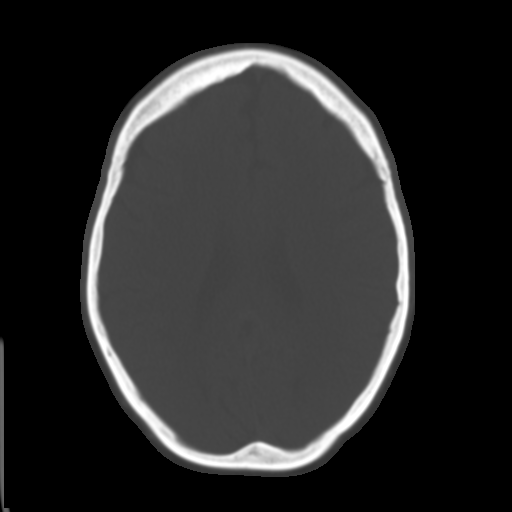
[im 17/28  brain]
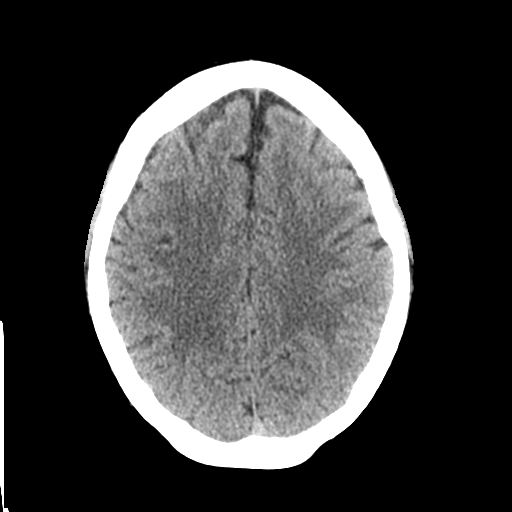
[im 20/28  brain]
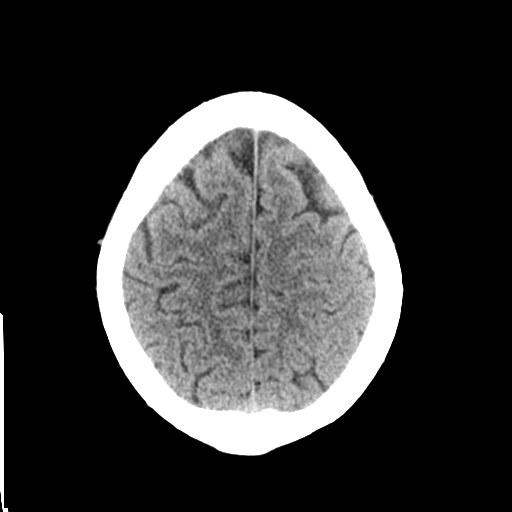
[im 23/28  brain]
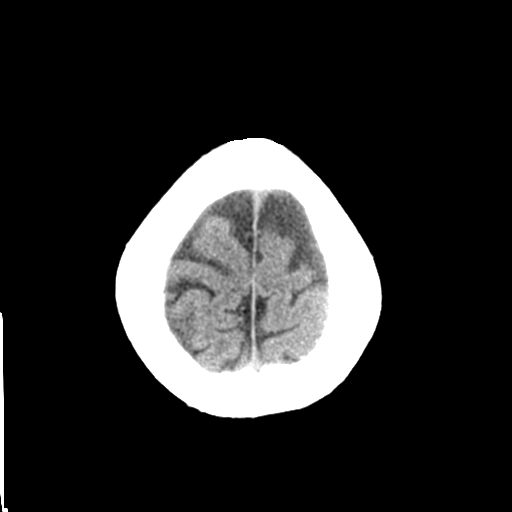
[im 26/28  brain]
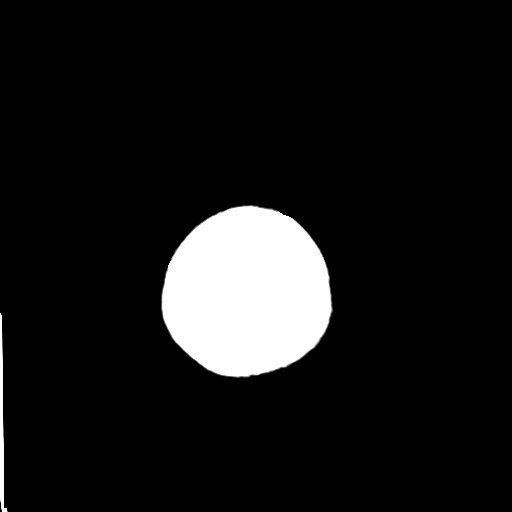
[im 26/28  bone]
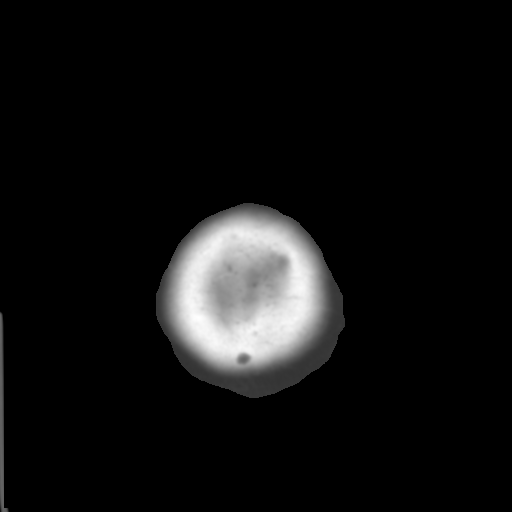

[Series 4: coronal soft tissue · coronal · 0.31mm/px · 3 of 65 slices shown]
[im 22/65  brain]
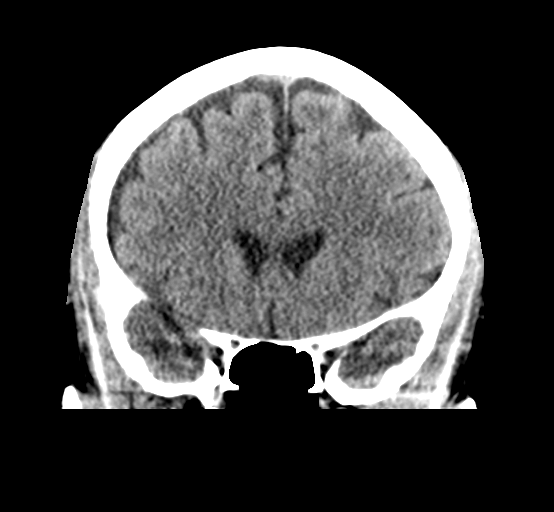
[im 29/65  brain]
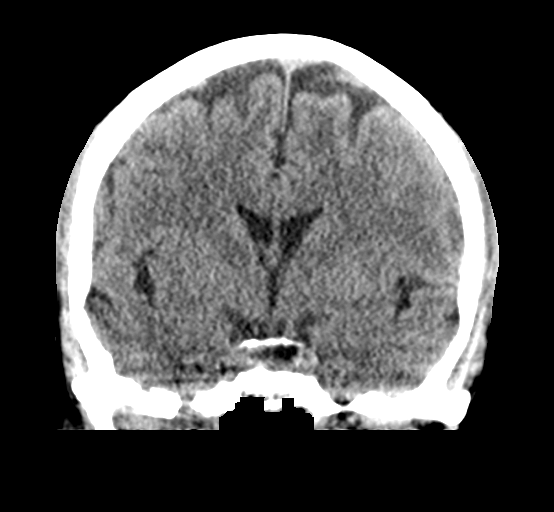
[im 36/65  brain]
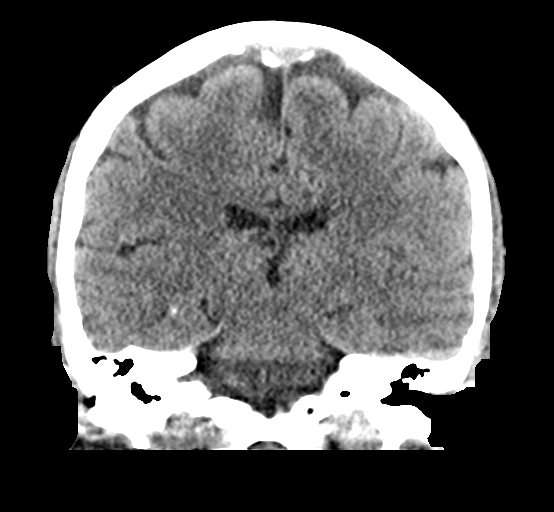

[Series 5: sagittal soft tissue · sagittal · 0.33mm/px · 3 of 52 slices shown]
[im 18/52  brain]
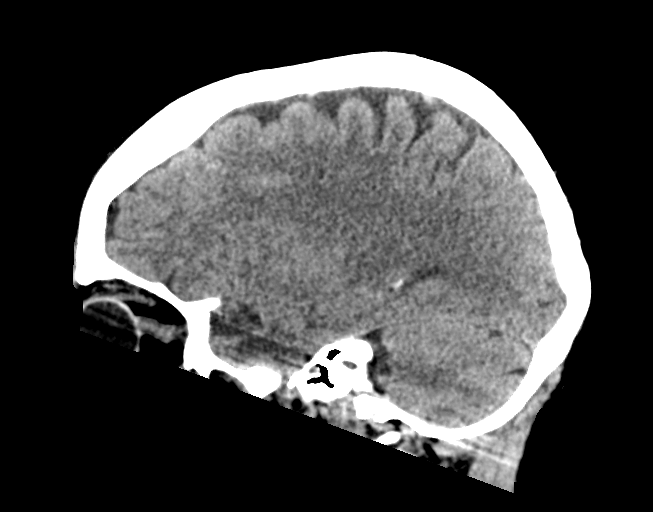
[im 26/52  brain]
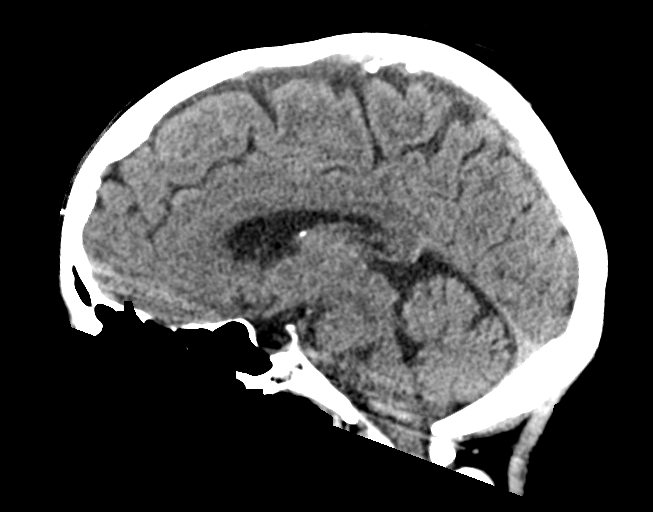
[im 35/52  brain]
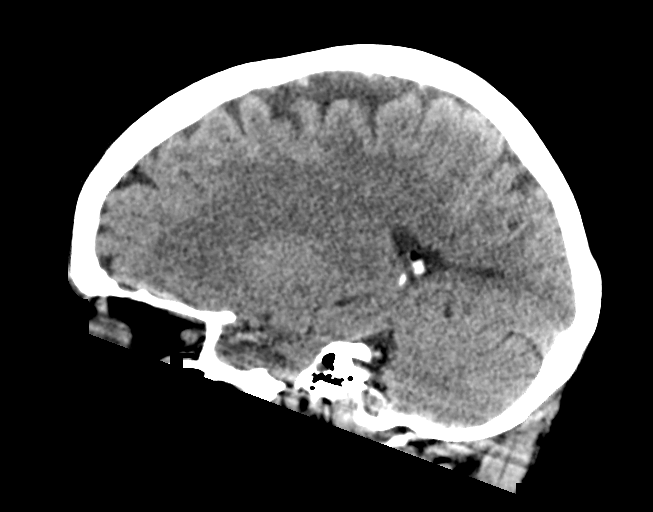

[15 of 45 positions shown; findings below may reference images not displayed]

FINDINGS: Brain: Mild generalized atrophy. Normal ventricular morphology. No
midline shift or mass effect. Small vessel chronic ischemic changes
of deep cerebral white matter. No intracranial hemorrhage, mass
lesion, or evidence of acute infarction. No extra-axial fluid
collections.

Vascular: No hyperdense vessels.

Skull: Intact

Sinuses/Orbits: Clear

Other: Narrowing of spinal canal at the C1 level, C1 ring appears
small, likely congenital, not previously assessed
IMPRESSION: Atrophy with small vessel chronic ischemic changes of deep cerebral
white matter.

No acute intracranial abnormalities.

Narrowing of spinal canal at the C1 level, likely congenital,
incompletely assessed on CT imaging of the head and not previously
evaluated; consider follow-up non emergent MR imaging of the
cervical spine to evaluate extent and cause.

## 2020-11-01 DIAGNOSIS — E119 Type 2 diabetes mellitus without complications: Secondary | ICD-10-CM | POA: Diagnosis not present

## 2020-11-01 DIAGNOSIS — E034 Atrophy of thyroid (acquired): Secondary | ICD-10-CM | POA: Diagnosis not present

## 2020-11-08 DIAGNOSIS — N183 Chronic kidney disease, stage 3 unspecified: Secondary | ICD-10-CM | POA: Diagnosis not present

## 2020-11-08 DIAGNOSIS — Z0000 Encounter for general adult medical examination without abnormal findings: Secondary | ICD-10-CM | POA: Diagnosis not present

## 2020-11-08 DIAGNOSIS — E1122 Type 2 diabetes mellitus with diabetic chronic kidney disease: Secondary | ICD-10-CM | POA: Diagnosis not present

## 2020-11-08 DIAGNOSIS — M353 Polymyalgia rheumatica: Secondary | ICD-10-CM | POA: Diagnosis not present

## 2020-11-08 DIAGNOSIS — E119 Type 2 diabetes mellitus without complications: Secondary | ICD-10-CM | POA: Diagnosis not present

## 2020-11-08 DIAGNOSIS — I129 Hypertensive chronic kidney disease with stage 1 through stage 4 chronic kidney disease, or unspecified chronic kidney disease: Secondary | ICD-10-CM | POA: Diagnosis not present

## 2020-11-08 DIAGNOSIS — E034 Atrophy of thyroid (acquired): Secondary | ICD-10-CM | POA: Diagnosis not present

## 2020-11-26 DIAGNOSIS — N1832 Chronic kidney disease, stage 3b: Secondary | ICD-10-CM | POA: Diagnosis not present

## 2020-11-26 DIAGNOSIS — E1122 Type 2 diabetes mellitus with diabetic chronic kidney disease: Secondary | ICD-10-CM | POA: Diagnosis not present

## 2020-11-26 DIAGNOSIS — I10 Essential (primary) hypertension: Secondary | ICD-10-CM | POA: Diagnosis not present

## 2020-11-30 ENCOUNTER — Other Ambulatory Visit: Payer: Self-pay

## 2020-11-30 ENCOUNTER — Emergency Department: Payer: Medicare HMO

## 2020-11-30 ENCOUNTER — Emergency Department
Admission: EM | Admit: 2020-11-30 | Discharge: 2020-11-30 | Disposition: A | Discharge status: Home / Self Care | Payer: Medicare HMO | Attending: Emergency Medicine | Admitting: Emergency Medicine

## 2020-11-30 DIAGNOSIS — I129 Hypertensive chronic kidney disease with stage 1 through stage 4 chronic kidney disease, or unspecified chronic kidney disease: Secondary | ICD-10-CM | POA: Diagnosis not present

## 2020-11-30 DIAGNOSIS — K529 Noninfective gastroenteritis and colitis, unspecified: Secondary | ICD-10-CM | POA: Diagnosis not present

## 2020-11-30 DIAGNOSIS — N183 Chronic kidney disease, stage 3 unspecified: Secondary | ICD-10-CM | POA: Diagnosis not present

## 2020-11-30 DIAGNOSIS — E1122 Type 2 diabetes mellitus with diabetic chronic kidney disease: Secondary | ICD-10-CM | POA: Diagnosis not present

## 2020-11-30 DIAGNOSIS — R1030 Lower abdominal pain, unspecified: Secondary | ICD-10-CM | POA: Diagnosis present

## 2020-11-30 DIAGNOSIS — Z8719 Personal history of other diseases of the digestive system: Secondary | ICD-10-CM | POA: Insufficient documentation

## 2020-11-30 DIAGNOSIS — R109 Unspecified abdominal pain: Secondary | ICD-10-CM | POA: Diagnosis not present

## 2020-11-30 DIAGNOSIS — Z20822 Contact with and (suspected) exposure to covid-19: Secondary | ICD-10-CM | POA: Insufficient documentation

## 2020-11-30 DIAGNOSIS — Z79899 Other long term (current) drug therapy: Secondary | ICD-10-CM | POA: Insufficient documentation

## 2020-11-30 DIAGNOSIS — Z853 Personal history of malignant neoplasm of breast: Secondary | ICD-10-CM | POA: Diagnosis not present

## 2020-11-30 DIAGNOSIS — R1110 Vomiting, unspecified: Secondary | ICD-10-CM | POA: Diagnosis not present

## 2020-11-30 DIAGNOSIS — Z87891 Personal history of nicotine dependence: Secondary | ICD-10-CM | POA: Insufficient documentation

## 2020-11-30 DIAGNOSIS — E039 Hypothyroidism, unspecified: Secondary | ICD-10-CM | POA: Insufficient documentation

## 2020-11-30 LAB — CBC
HCT: 36.4 % (ref 36.0–46.0)
Hemoglobin: 11.8 g/dL — ABNORMAL LOW (ref 12.0–15.0)
MCH: 27.4 pg (ref 26.0–34.0)
MCHC: 32.4 g/dL (ref 30.0–36.0)
MCV: 84.7 fL (ref 80.0–100.0)
Platelets: 248 10*3/uL (ref 150–400)
RBC: 4.3 MIL/uL (ref 3.87–5.11)
RDW: 13.8 % (ref 11.5–15.5)
WBC: 4.3 10*3/uL (ref 4.0–10.5)
nRBC: 0 % (ref 0.0–0.2)

## 2020-11-30 LAB — URINALYSIS, COMPLETE (UACMP) WITH MICROSCOPIC
Bacteria, UA: NONE SEEN
Bilirubin Urine: NEGATIVE
Glucose, UA: NEGATIVE mg/dL
Ketones, ur: NEGATIVE mg/dL
Leukocytes,Ua: NEGATIVE
Nitrite: NEGATIVE
Protein, ur: NEGATIVE mg/dL
Specific Gravity, Urine: 1.013 (ref 1.005–1.030)
Squamous Epithelial / HPF: NONE SEEN (ref 0–5)
pH: 5 (ref 5.0–8.0)

## 2020-11-30 LAB — RESP PANEL BY RT-PCR (FLU A&B, COVID) ARPGX2
Influenza A by PCR: NEGATIVE
Influenza B by PCR: NEGATIVE
SARS Coronavirus 2 by RT PCR: NEGATIVE

## 2020-11-30 LAB — COMPREHENSIVE METABOLIC PANEL
ALT: 48 U/L — ABNORMAL HIGH (ref 0–44)
AST: 38 U/L (ref 15–41)
Albumin: 4.1 g/dL (ref 3.5–5.0)
Alkaline Phosphatase: 81 U/L (ref 38–126)
Anion gap: 11 (ref 5–15)
BUN: 14 mg/dL (ref 8–23)
CO2: 21 mmol/L — ABNORMAL LOW (ref 22–32)
Calcium: 8.6 mg/dL — ABNORMAL LOW (ref 8.9–10.3)
Chloride: 108 mmol/L (ref 98–111)
Creatinine, Ser: 1.22 mg/dL — ABNORMAL HIGH (ref 0.44–1.00)
GFR, Estimated: 45 mL/min — ABNORMAL LOW (ref >60)
Glucose, Bld: 134 mg/dL — ABNORMAL HIGH (ref 70–99)
Potassium: 3.5 mmol/L (ref 3.5–5.1)
Sodium: 140 mmol/L (ref 135–145)
Total Bilirubin: 0.8 mg/dL (ref 0.3–1.2)
Total Protein: 7.8 g/dL (ref 6.5–8.1)

## 2020-11-30 LAB — LIPASE, BLOOD: Lipase: 30 U/L (ref 11–51)

## 2020-11-30 MED ORDER — ONDANSETRON 4 MG PO TBDP
4.0000 mg | ORAL_TABLET | Freq: Three times a day (TID) | ORAL | 0 refills | Status: DC | PRN
Start: 2020-11-30 — End: 2021-03-13

## 2020-11-30 MED ORDER — SODIUM CHLORIDE 0.9 % IV BOLUS
500.0000 mL | Freq: Once | INTRAVENOUS | Status: AC
  Administered 2020-11-30: 500 mL via INTRAVENOUS

## 2020-11-30 MED ORDER — FENTANYL CITRATE (PF) 100 MCG/2ML IJ SOLN
25.0000 ug | Freq: Once | INTRAMUSCULAR | Status: AC
  Administered 2020-11-30: 25 ug via INTRAVENOUS
  Filled 2020-11-30: qty 2

## 2020-11-30 MED ORDER — ONDANSETRON HCL 4 MG/2ML IJ SOLN
4.0000 mg | Freq: Once | INTRAMUSCULAR | Status: AC | PRN
  Administered 2020-11-30: 4 mg via INTRAVENOUS
  Filled 2020-11-30: qty 2

## 2020-11-30 MED ORDER — IOHEXOL 300 MG/ML  SOLN
75.0000 mL | Freq: Once | INTRAMUSCULAR | Status: AC | PRN
  Administered 2020-11-30: 75 mL via INTRAVENOUS

## 2020-11-30 NOTE — ED Provider Notes (Signed)
[facility] Emergency Department Provider Note  ____________________________________________   Event Date/Time   First MD Initiated Contact with Patient 11/30/20 1201     (approximate)  I have reviewed the triage vital signs and the nursing notes.   HISTORY  Chief Complaint Abdominal Pain    HPI ONELIA CADMUS is a 80 y.o. female with IBS, HTN, fibromyalgia, who comes inf or abdominal pain.  Patient states that this morning she started to develop lower abdominal pain that was cramping, severe, constant, nothing made it better, nothing made it worse.  She states that she is had 10 episodes of diarrhea.  Took some Imodium.  She also had 4 episodes of nausea, vomiting.  She has had some recent antibiotics.  Denies any chest pain, shortness of breath, recent falls            Past Medical History:  Diagnosis Date  . Arthritis   . Breast cancer ([place]) 2002   Right breast ca with chemo and rad tx. Left breast ca 1995 with rad tx.   . Fibromyalgia   . Hypertension   . IBS (irritable bowel syndrome)     Patient Active Problem List   Diagnosis Date Noted  . Malignant hypertension 05/09/2019  . CRF (chronic renal failure), stage 3 (moderate) ([place]) 07/14/2017  . Controlled type 2 diabetes mellitus without complication, without long-term current use of insulin ([place]) 01/08/2017  . Syncope 01/26/2016  . Benign essential hypertension 06/19/2014  . Hypercholesterolemia 06/19/2014  . Hypothyroidism due to acquired atrophy of thyroid 06/19/2014  . PMR (polymyalgia rheumatica) (HCC) 06/19/2014  . GERD (gastroesophageal reflux disease) 12/08/2013    Past Surgical History:  Procedure Laterality Date  . BREAST EXCISIONAL BIOPSY Left 1995   postive for breast ca. lumpectomy and rad tx  . MASTECTOMY Right 2002   Breast ca  . RENAL ANGIOGRAPHY N/A 05/13/2019   Procedure: RENAL ANGIOGRAPHY;  Surgeon: Katha Cabal, MD;  Location: [facility];   Service: Cardiovascular;  Laterality: N/A;    Prior to Admission medications   Medication Sig Start Date End Date Taking? Authorizing Provider  alendronate (FOSAMAX) 70 MG tablet Take 70 mg by mouth every Monday. Take on a empty stomach with 6-8 oz of liquid.  Take on Mondays. 12/25/15   [provider]  cloNIDine (CATAPRES) 0.2 MG tablet Take 0.2 mg by mouth 2 (two) times daily.  03/04/19   [provider]  doxazosin (CARDURA) 2 MG tablet Take 2 mg by mouth every evening.  02/21/19   [provider]  hydrALAZINE (APRESOLINE) 25 MG tablet Take 25 mg by mouth 3 (three) times daily.  03/28/19   [provider]  levothyroxine (SYNTHROID) 75 MCG tablet Take 75 mcg by mouth daily before breakfast.    [provider]  lisinopril (PRINIVIL,ZESTRIL) 20 MG tablet Take 20 mg by mouth 2 (two) times daily. 01/08/16   [provider]  lovastatin (MEVACOR) 20 MG tablet Take 20 mg by mouth daily with supper.  01/08/16   [provider]  metoprolol tartrate (LOPRESSOR) 25 MG tablet Take 25 mg by mouth 2 (two) times daily.  04/09/19   [provider]  omeprazole (PRILOSEC) 20 MG capsule Take 20 mg by mouth daily. 01/08/16   [provider]    Allergies Hydrocodone and Tramadol  Family History  Problem Relation Age of Onset  . Hypertension Other   . Breast cancer Neg Hx     Social History Social  History   Tobacco Use  . Smoking status: Former Research scientist (life sciences)  . Smokeless tobacco: Never Used  Substance Use Topics  . Alcohol use: No    Alcohol/week: 0.0 standard drinks  . Drug use: Never      Review of Systems Constitutional: No fever/chills Eyes: No visual changes. ENT: No sore throat. Cardiovascular: Denies chest pain. Respiratory: Denies shortness of breath. Gastrointestinal: abdominal pain, nausea, vomiting, diarrhea Genitourinary: Negative for dysuria. Musculoskeletal: Negative for back pain. Skin: Negative for  rash. Neurological: Negative for headaches, focal weakness or numbness. All other ROS negative ____________________________________________   PHYSICAL EXAM:  VITAL SIGNS: ED Triage Vitals  Enc Vitals Group     BP 11/30/20 1148 (!) 186/71     Pulse Rate 11/30/20 1148 80     Resp 11/30/20 1148 20     Temp 11/30/20 1150 98.2 F (36.8 C)     Temp Source 11/30/20 1148 Oral     SpO2 11/30/20 1148 96 %     Weight 11/30/20 1147 134 lb (60.8 kg)     Height 11/30/20 1147 5\' 5"  (1.651 m)     Head Circumference --      Peak Flow --      Pain Score 11/30/20 1147 9     Pain Loc --      Pain Edu? --      Excl. in [place]? --     Constitutional: Alert and oriented. Well appearing and in no acute distress. Eyes: Conjunctivae are normal. EOMI. Head: Atraumatic. Nose: No congestion/rhinnorhea. Mouth/Throat: Mucous membranes are moist.   Neck: No stridor. Trachea Midline. FROM Cardiovascular: Normal rate, regular rhythm. Grossly normal heart sounds.  Good peripheral circulation. Respiratory: Normal respiratory effort.  No retractions. Lungs CTAB. Gastrointestinal: Tender in the lower abdomen.  No distention. No abdominal bruits.  Musculoskeletal: No lower extremity tenderness nor edema.  No joint effusions. Neurologic:  Normal speech and language. No gross focal neurologic deficits are appreciated.  Skin:  Skin is warm, dry and intact. No rash noted. Psychiatric: Mood and affect are normal. Speech and behavior are normal. GU: Deferred   ____________________________________________   LABS (all labs ordered are listed, but only abnormal results are displayed)  Labs Reviewed  COMPREHENSIVE METABOLIC PANEL - Abnormal; Notable for the following components:      Result Value   CO2 21 (*)    Glucose, Bld 134 (*)    Creatinine, Ser 1.22 (*)    Calcium 8.6 (*)    ALT 48 (*)    GFR, Estimated 45 (*)    All other components within normal limits  CBC - Abnormal; Notable for the following  components:   Hemoglobin 11.8 (*)    All other components within normal limits  URINALYSIS, COMPLETE (UACMP) WITH MICROSCOPIC - Abnormal; Notable for the following components:   Color, Urine STRAW (*)    APPearance CLEAR (*)    Hgb urine dipstick SMALL (*)    All other components within normal limits  RESP PANEL BY RT-PCR (FLU A&B, COVID) ARPGX2  GASTROINTESTINAL PANEL BY PCR, STOOL (REPLACES STOOL CULTURE)  C DIFFICILE QUICK SCREEN W PCR REFLEX  LIPASE, BLOOD   ____________________________________________   RADIOLOGY   Official radiology report(s): CT ABDOMEN PELVIS W CONTRAST  Result Date: 11/30/2020 CLINICAL DATA:  Abdominal pain, nausea and vomiting for 1 day. EXAM: CT ABDOMEN AND PELVIS WITH CONTRAST TECHNIQUE: Multidetector CT imaging of the abdomen and pelvis was performed using the standard protocol following bolus administration of intravenous contrast.  CONTRAST:  75 mL OMNIPAQUE IOHEXOL 300 MG/ML  SOLN COMPARISON:  CT abdomen and pelvis 01/26/2016. FINDINGS: Lower chest: Small pericardial effusion and trace bilateral pleural effusions. Mild dependent atelectasis. Hepatobiliary: No focal liver abnormality is seen. No gallstones, gallbladder wall thickening, or biliary dilatation. Pancreas: Unremarkable. No pancreatic ductal dilatation or surrounding inflammatory changes. Spleen: Normal in size without focal abnormality. There are some unchanged calcifications along the superior, medial aspect of the spleen which may be due to remote injury or infection. Adrenals/Urinary Tract: Adrenal glands are unremarkable. Kidneys are normal, without renal calculi, focal lesion, or hydronephrosis. Bladder is unremarkable. Stomach/Bowel: Small hiatal hernia is noted. The stomach is otherwise unremarkable. There is no evidence of small-bowel obstruction. A few nondilated loops of fluid-filled small bowel and air-fluid levels are also seen in the ascending colon are seen. The colon is otherwise  normal in appearance without wall thickening. The appendix appears normal. Vascular/Lymphatic: Aortic atherosclerosis. No enlarged abdominal or pelvic lymph nodes. Reproductive: Uterus and bilateral adnexa are unremarkable. Other: There is a very small volume of free pelvic fluid. Musculoskeletal: No acute or focal abnormality. Lower thoracic and lower lumbar degenerative change is stable in appearance. IMPRESSION: Findings most suggestive of enteritis with an associated small volume of free pelvic fluid. No CT signs of ischemia. Trace bilateral pleural effusions and small pericardial effusion. Aortic Atherosclerosis (ICD10-I70.0). Electronically Signed   By: Inge Rise M.D.   On: 11/30/2020 13:49    ____________________________________________   PROCEDURES  Procedure(s) performed (including Critical Care):  Procedures   ____________________________________________   INITIAL IMPRESSION / ASSESSMENT AND PLAN / ED COURSE  KILEIGH ORTMANN was evaluated in Emergency Department on 11/30/2020 for the symptoms described in the history of present illness. She was evaluated in the context of the global COVID-19 pandemic, which necessitated consideration that the patient might be at risk for infection with the SARS-CoV-2 virus that causes COVID-19. Institutional protocols and algorithms that pertain to the evaluation of patients at risk for COVID-19 are in a state of rapid change based on information released by regulatory bodies including the CDC and federal and state organizations. These policies and algorithms were followed during the patient's care in the ED.    Patient is an 80 year old who comes in with multiple episodes of diarrhea, vomiting and lower abdominal pain.  I suspect this is more likely related to a gastroenteritis but will get CT scan given the abdominal pain to make sure no evidence of diverticulitis, perforation, abscess, obstruction.  Will get labs evaluate for Electra  abnormalities, give patient some IV fluids and Zofran and IV fentanyl to help with pain  Kidney function around baseline.  Electrolytes are normal.  Hemoglobin is stable.  Urine without evidence of UTI  CT scan is consistent with enteritis.  At this time patient is afebrile.  Denies any blood in her stool.  Is not immunosuppressive.  I suspect is most likely viral in nature I did instruct them that if she continues to have diarrhea for the next few days that she can get a sample and send it to her primary care doctor to make sure is not C. difficile but it seems less likely given she is not been able to give a sample here.  We discussed Zofran.  Patient is tolerating p.o. and feels comfortable with discharge home at this time.  There are some incidental findings on her CT.  Did provide her with a report of that if she can follow-up with outpatient.  For  this small pericardial effusion she does not show signs of decompensation from it denies any chest pain or shortness of breath  I discussed the provisional nature of ED diagnosis, the treatment so far, the ongoing plan of care, follow up appointments and return precautions with the patient and any family or support people present. They expressed understanding and agreed with the plan, discharged home.           ____________________________________________   FINAL CLINICAL IMPRESSION(S) / ED DIAGNOSES   Final diagnoses:  Gastroenteritis      MEDICATIONS GIVEN DURING THIS VISIT:  Medications  ondansetron (ZOFRAN) injection 4 mg (4 mg Intravenous Given 11/30/20 1155)  sodium chloride 0.9 % bolus 500 mL (0 mLs Intravenous Stopped 11/30/20 1351)  fentaNYL (SUBLIMAZE) injection 25 mcg (25 mcg Intravenous Given 11/30/20 1248)  iohexol (OMNIPAQUE) 300 MG/ML solution 75 mL (75 mLs Intravenous Contrast Given 11/30/20 1311)     ED Discharge Orders         Ordered    ondansetron (ZOFRAN ODT) 4 MG disintegrating tablet  Every 8 hours PRN         11/30/20 1549           Note:  This document was prepared using Dragon voice recognition software and may include unintentional dictation errors.   Vanessa Obion, MD 11/30/20 [phone]

## 2020-11-30 NOTE — ED Triage Notes (Signed)
First Nurse Note:  Arrives with C/O abdominal pain, Nausea, vomiting x 1 day.  Patient is AAOx3.  Skin warm and dry. NAD

## 2020-11-30 NOTE — ED Notes (Signed)
Patient feeling much better at this time, provided with crackers/ginger ale for PO challenge

## 2020-11-30 NOTE — Discharge Instructions (Addendum)
Take Tylenol 1 g every 8 hours to help with pain.  Take Zofran to help with nausea.  If after 24 hours she continued to have a lot of stooling you should follow-up with your PCP to have stool testing done to make sure no evidence of C. difficile.  If C. difficile is negative you can also take Imodium to help with diarrhea  IMPRESSION: Findings most suggestive of enteritis with an associated small volume of free pelvic fluid. No CT signs of ischemia.   Trace bilateral pleural effusions and small pericardial effusion.

## 2020-11-30 NOTE — ED Triage Notes (Signed)
Pt c/o abd cramping with N/V/D since yesterday.

## 2021-01-09 DIAGNOSIS — H524 Presbyopia: Secondary | ICD-10-CM | POA: Diagnosis not present

## 2021-01-09 DIAGNOSIS — H35033 Hypertensive retinopathy, bilateral: Secondary | ICD-10-CM | POA: Diagnosis not present

## 2021-02-28 ENCOUNTER — Other Ambulatory Visit: Payer: Self-pay | Admitting: Internal Medicine

## 2021-02-28 DIAGNOSIS — Z1231 Encounter for screening mammogram for malignant neoplasm of breast: Secondary | ICD-10-CM

## 2021-03-06 DIAGNOSIS — E119 Type 2 diabetes mellitus without complications: Secondary | ICD-10-CM | POA: Diagnosis not present

## 2021-03-06 DIAGNOSIS — E034 Atrophy of thyroid (acquired): Secondary | ICD-10-CM | POA: Diagnosis not present

## 2021-03-13 ENCOUNTER — Encounter: Payer: Self-pay | Admitting: *Deleted

## 2021-03-13 ENCOUNTER — Observation Stay
Admission: EM | Admit: 2021-03-13 | Discharge: 2021-03-15 | Disposition: A | Discharge status: Home / Self Care | Payer: Medicare HMO | Attending: Hospitalist | Admitting: Hospitalist

## 2021-03-13 ENCOUNTER — Other Ambulatory Visit: Payer: Self-pay

## 2021-03-13 ENCOUNTER — Emergency Department: Payer: Medicare HMO

## 2021-03-13 DIAGNOSIS — E1122 Type 2 diabetes mellitus with diabetic chronic kidney disease: Secondary | ICD-10-CM | POA: Diagnosis not present

## 2021-03-13 DIAGNOSIS — Z7984 Long term (current) use of oral hypoglycemic drugs: Secondary | ICD-10-CM | POA: Diagnosis not present

## 2021-03-13 DIAGNOSIS — K529 Noninfective gastroenteritis and colitis, unspecified: Secondary | ICD-10-CM | POA: Diagnosis present

## 2021-03-13 DIAGNOSIS — I952 Hypotension due to drugs: Principal | ICD-10-CM | POA: Diagnosis present

## 2021-03-13 DIAGNOSIS — I129 Hypertensive chronic kidney disease with stage 1 through stage 4 chronic kidney disease, or unspecified chronic kidney disease: Secondary | ICD-10-CM | POA: Insufficient documentation

## 2021-03-13 DIAGNOSIS — Z87891 Personal history of nicotine dependence: Secondary | ICD-10-CM | POA: Diagnosis not present

## 2021-03-13 DIAGNOSIS — R112 Nausea with vomiting, unspecified: Secondary | ICD-10-CM | POA: Diagnosis not present

## 2021-03-13 DIAGNOSIS — E034 Atrophy of thyroid (acquired): Secondary | ICD-10-CM | POA: Diagnosis present

## 2021-03-13 DIAGNOSIS — N183 Chronic kidney disease, stage 3 unspecified: Secondary | ICD-10-CM | POA: Diagnosis not present

## 2021-03-13 DIAGNOSIS — K219 Gastro-esophageal reflux disease without esophagitis: Secondary | ICD-10-CM | POA: Diagnosis present

## 2021-03-13 DIAGNOSIS — R1084 Generalized abdominal pain: Secondary | ICD-10-CM | POA: Diagnosis not present

## 2021-03-13 DIAGNOSIS — N1832 Chronic kidney disease, stage 3b: Secondary | ICD-10-CM | POA: Insufficient documentation

## 2021-03-13 DIAGNOSIS — R52 Pain, unspecified: Secondary | ICD-10-CM | POA: Diagnosis not present

## 2021-03-13 DIAGNOSIS — Z20822 Contact with and (suspected) exposure to covid-19: Secondary | ICD-10-CM | POA: Diagnosis not present

## 2021-03-13 DIAGNOSIS — E039 Hypothyroidism, unspecified: Secondary | ICD-10-CM | POA: Diagnosis not present

## 2021-03-13 DIAGNOSIS — R000 Tachycardia, unspecified: Secondary | ICD-10-CM | POA: Diagnosis not present

## 2021-03-13 DIAGNOSIS — Z853 Personal history of malignant neoplasm of breast: Secondary | ICD-10-CM | POA: Insufficient documentation

## 2021-03-13 DIAGNOSIS — I10 Essential (primary) hypertension: Secondary | ICD-10-CM | POA: Diagnosis not present

## 2021-03-13 DIAGNOSIS — Z79899 Other long term (current) drug therapy: Secondary | ICD-10-CM | POA: Diagnosis not present

## 2021-03-13 LAB — CBC
HCT: 43.6 % (ref 36.0–46.0)
Hemoglobin: 14.3 g/dL (ref 12.0–15.0)
MCH: 27.4 pg (ref 26.0–34.0)
MCHC: 32.8 g/dL (ref 30.0–36.0)
MCV: 83.5 fL (ref 80.0–100.0)
Platelets: 317 10*3/uL (ref 150–400)
RBC: 5.22 MIL/uL — ABNORMAL HIGH (ref 3.87–5.11)
RDW: 13.5 % (ref 11.5–15.5)
WBC: 8.4 10*3/uL (ref 4.0–10.5)
nRBC: 0 % (ref 0.0–0.2)

## 2021-03-13 LAB — CBC WITH DIFFERENTIAL/PLATELET
Abs Immature Granulocytes: 0.01 10*3/uL (ref 0.00–0.07)
Basophils Absolute: 0 10*3/uL (ref 0.0–0.1)
Basophils Relative: 0 %
Eosinophils Absolute: 0 10*3/uL (ref 0.0–0.5)
Eosinophils Relative: 0 %
HCT: 28.6 % — ABNORMAL LOW (ref 36.0–46.0)
Hemoglobin: 9.4 g/dL — ABNORMAL LOW (ref 12.0–15.0)
Immature Granulocytes: 0 %
Lymphocytes Relative: 22 %
Lymphs Abs: 1.2 10*3/uL (ref 0.7–4.0)
MCH: 27.7 pg (ref 26.0–34.0)
MCHC: 32.9 g/dL (ref 30.0–36.0)
MCV: 84.4 fL (ref 80.0–100.0)
Monocytes Absolute: 0.5 10*3/uL (ref 0.1–1.0)
Monocytes Relative: 9 %
Neutro Abs: 3.8 10*3/uL (ref 1.7–7.7)
Neutrophils Relative %: 69 %
Platelets: 209 10*3/uL (ref 150–400)
RBC: 3.39 MIL/uL — ABNORMAL LOW (ref 3.87–5.11)
RDW: 13.9 % (ref 11.5–15.5)
WBC: 5.6 10*3/uL (ref 4.0–10.5)
nRBC: 0 % (ref 0.0–0.2)

## 2021-03-13 LAB — COMPREHENSIVE METABOLIC PANEL
ALT: 15 U/L (ref 0–44)
AST: 22 U/L (ref 15–41)
Albumin: 3.8 g/dL (ref 3.5–5.0)
Alkaline Phosphatase: 65 U/L (ref 38–126)
Anion gap: 10 (ref 5–15)
BUN: 16 mg/dL (ref 8–23)
CO2: 21 mmol/L — ABNORMAL LOW (ref 22–32)
Calcium: 8.7 mg/dL — ABNORMAL LOW (ref 8.9–10.3)
Chloride: 106 mmol/L (ref 98–111)
Creatinine, Ser: 1.67 mg/dL — ABNORMAL HIGH (ref 0.44–1.00)
GFR, Estimated: 31 mL/min — ABNORMAL LOW (ref >60)
Glucose, Bld: 193 mg/dL — ABNORMAL HIGH (ref 70–99)
Potassium: 3.9 mmol/L (ref 3.5–5.1)
Sodium: 137 mmol/L (ref 135–145)
Total Bilirubin: 1.3 mg/dL — ABNORMAL HIGH (ref 0.3–1.2)
Total Protein: 7.3 g/dL (ref 6.5–8.1)

## 2021-03-13 LAB — URINALYSIS, COMPLETE (UACMP) WITH MICROSCOPIC
Bilirubin Urine: NEGATIVE
Glucose, UA: NEGATIVE mg/dL
Hgb urine dipstick: NEGATIVE
Ketones, ur: 20 mg/dL — AB
Nitrite: NEGATIVE
Protein, ur: 30 mg/dL — AB
Specific Gravity, Urine: 1.012 (ref 1.005–1.030)
pH: 5 (ref 5.0–8.0)

## 2021-03-13 LAB — CBG MONITORING, ED: Glucose-Capillary: 93 mg/dL (ref 70–99)

## 2021-03-13 LAB — TROPONIN I (HIGH SENSITIVITY): Troponin I (High Sensitivity): 15 ng/L (ref <18)

## 2021-03-13 LAB — LIPASE, BLOOD: Lipase: 28 U/L (ref 11–51)

## 2021-03-13 LAB — LACTIC ACID, PLASMA: Lactic Acid, Venous: 1.8 mmol/L (ref 0.5–1.9)

## 2021-03-13 LAB — RESP PANEL BY RT-PCR (FLU A&B, COVID) ARPGX2
Influenza A by PCR: NEGATIVE
Influenza B by PCR: NEGATIVE
SARS Coronavirus 2 by RT PCR: NEGATIVE

## 2021-03-13 MED ORDER — ENOXAPARIN SODIUM 30 MG/0.3ML IJ SOSY
30.0000 mg | PREFILLED_SYRINGE | INTRAMUSCULAR | Status: DC
  Administered 2021-03-14: 30 mg via SUBCUTANEOUS
  Filled 2021-03-13: qty 0.3

## 2021-03-13 MED ORDER — FENTANYL CITRATE (PF) 100 MCG/2ML IJ SOLN
50.0000 ug | Freq: Once | INTRAMUSCULAR | Status: AC
  Administered 2021-03-13: 50 ug via INTRAVENOUS
  Filled 2021-03-13: qty 2

## 2021-03-13 MED ORDER — SODIUM CHLORIDE 0.9 % IV SOLN: Freq: Once | INTRAVENOUS | Status: AC

## 2021-03-13 MED ORDER — DICYCLOMINE HCL 10 MG PO CAPS
20.0000 mg | ORAL_CAPSULE | Freq: Once | ORAL | Status: AC
  Administered 2021-03-13: 20 mg via ORAL
  Filled 2021-03-13: qty 2

## 2021-03-13 MED ORDER — PRAVASTATIN SODIUM 20 MG PO TABS
20.0000 mg | ORAL_TABLET | Freq: Every day | ORAL | Status: DC
  Administered 2021-03-14: 20 mg via ORAL
  Filled 2021-03-13 (×2): qty 1

## 2021-03-13 MED ORDER — ONDANSETRON 4 MG PO TBDP: 4.0000 mg | ORAL_TABLET | Freq: Four times a day (QID) | ORAL | 0 refills | Status: DC | PRN

## 2021-03-13 MED ORDER — INSULIN ASPART 100 UNIT/ML IJ SOLN
0.0000 [IU] | Freq: Three times a day (TID) | INTRAMUSCULAR | Status: DC
  Filled 2021-03-13: qty 1

## 2021-03-13 MED ORDER — SODIUM CHLORIDE 0.9 % IV SOLN: INTRAVENOUS | Status: AC

## 2021-03-13 MED ORDER — LEVOTHYROXINE SODIUM 50 MCG PO TABS
75.0000 ug | ORAL_TABLET | Freq: Every day | ORAL | Status: DC
  Administered 2021-03-14 – 2021-03-15 (×2): 75 ug via ORAL
  Filled 2021-03-13 (×2): qty 1

## 2021-03-13 MED ORDER — SODIUM CHLORIDE 0.9 % IV SOLN: INTRAVENOUS | Status: DC

## 2021-03-13 MED ORDER — ACETAMINOPHEN 325 MG PO TABS
650.0000 mg | ORAL_TABLET | Freq: Four times a day (QID) | ORAL | Status: DC | PRN
  Filled 2021-03-13: qty 2

## 2021-03-13 MED ORDER — PANTOPRAZOLE SODIUM 40 MG PO TBEC
40.0000 mg | DELAYED_RELEASE_TABLET | Freq: Every day | ORAL | Status: DC
  Administered 2021-03-13 – 2021-03-15 (×3): 40 mg via ORAL
  Filled 2021-03-13 (×3): qty 1

## 2021-03-13 MED ORDER — ONDANSETRON HCL 4 MG/2ML IJ SOLN: 4.0000 mg | Freq: Four times a day (QID) | INTRAMUSCULAR | Status: DC | PRN

## 2021-03-13 MED ORDER — ONDANSETRON HCL 4 MG PO TABS: 4.0000 mg | ORAL_TABLET | Freq: Four times a day (QID) | ORAL | Status: DC | PRN

## 2021-03-13 MED ORDER — DICYCLOMINE HCL 10 MG PO CAPS
20.0000 mg | ORAL_CAPSULE | Freq: Once | ORAL | Status: DC
  Filled 2021-03-13: qty 2

## 2021-03-13 MED ORDER — DICYCLOMINE HCL 10 MG PO CAPS
10.0000 mg | ORAL_CAPSULE | Freq: Three times a day (TID) | ORAL | Status: DC
  Administered 2021-03-13 – 2021-03-15 (×6): 10 mg via ORAL
  Filled 2021-03-13 (×7): qty 1

## 2021-03-13 MED ORDER — ONDANSETRON HCL 4 MG/2ML IJ SOLN
4.0000 mg | Freq: Once | INTRAMUSCULAR | Status: AC
  Administered 2021-03-13: 4 mg via INTRAVENOUS
  Filled 2021-03-13: qty 2

## 2021-03-13 MED ORDER — DICYCLOMINE HCL 20 MG PO TABS: 20.0000 mg | ORAL_TABLET | Freq: Three times a day (TID) | ORAL | 0 refills | Status: AC | PRN

## 2021-03-13 MED ORDER — ACETAMINOPHEN 650 MG RE SUPP: 650.0000 mg | Freq: Four times a day (QID) | RECTAL | Status: DC | PRN

## 2021-03-13 MED ORDER — IOHEXOL 9 MG/ML PO SOLN
500.0000 mL | ORAL | Status: AC
  Administered 2021-03-13: 500 mL via ORAL

## 2021-03-13 NOTE — ED Notes (Signed)
Patient took 1 tab 25mg  metoprolol, 1 tab 25mg  lisinopril, 1 tab 0.3mg  clonidine, 1 tab 50mg  hydralizine PO from home medications that was brought in with her. In to discharge patient to find hypotensive on monitor and manual cuff. MD notified. 1LNS bolus started. Instructed patient to not take any more of medications from home.

## 2021-03-13 NOTE — H&P (Addendum)
History and Physical    Janice Galloway GBT:[id number] DOB: 1941/01/15 DOA: 03/13/2021  PCP: Baxter Hire, MD   Patient coming from: Home  I have personally briefly reviewed patient's old medical records in [place]  Chief Complaint: Abdominal pain  HPI: Janice Galloway is a 80 y.o. 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  She describes the pain as diffuse, crampy and severe rated 8 x 10 in intensity at its worst.  Pain is nonradiating and is associated with nausea, vomiting and an episode of diarrhea. She denies having any chest pain, no shortness of breath, no dizziness, no lightheadedness, no palpitations, no diaphoresis, no blurred vision, no focal deficits, no cough, no fever, no chills. Labs show sodium 137, potassium 3.9, chloride 106, bicarb 21, glucose 193, BUN 16, creatinine 1.67 compared to baseline of 1.2, calcium 8.7, alkaline phosphatase 65, albumin 3.8, lipase 28, AST 22, ALT 15, total protein 7.3, lactic acid 1.8, white count 8.4, hemoglobin 14.3, hematocrit 43.6, MCV 83.5, RDW 13.5, platelet count 317 CT scan of abdomen and pelvis shows ileitis.  Small volume ascites.  Aortic atherosclerosis. Twelve-lead EKG reviewed by me shows sinus tachycardia    ED Course: Patient is an 80 year old African-American female who presents to the ER for evaluation of abdominal pain and had a CT scan of abdomen and pelvis that showed ileitis. Patient's abdominal pain improved in the ER and she was able to tolerate oral intake.  GI was consulted and patient will follow-up with them as an outpatient. Patient took her oral antihypertensive medications which include clonidine 0.3 mg, metoprolol 25 mg, lisinopril 20  mg and hydralazine 50 mg and her blood pressure dropped to 50 systolic. She received IV fluid bolus and will be referred to observation status for further evaluation.   Review of Systems: As per HPI otherwise all other systems reviewed and negative.    Past Medical History:  Diagnosis Date   Arthritis    Breast cancer ([place]) 2002   Right breast ca with chemo and rad tx. Left breast ca 1995 with rad tx.    Fibromyalgia    Hypertension    IBS (irritable bowel syndrome)     Past Surgical History:  Procedure Laterality Date   BREAST EXCISIONAL BIOPSY Left 1995   postive for breast ca. lumpectomy and rad tx   MASTECTOMY Right 2002   Breast ca   RENAL ANGIOGRAPHY N/A 05/13/2019   Procedure: RENAL ANGIOGRAPHY;  Surgeon: Katha Cabal, MD;  Location: [facility];  Service: Cardiovascular;  Laterality: N/A;     reports that she has quit smoking. She has never used smokeless tobacco. She reports that she does not drink alcohol and does not use drugs.  Allergies  Allergen Reactions   Hydrocodone     Other reaction(s): Unknown; pt does not remember reaction   Tramadol Nausea Only    Family History  Problem Relation Age of Onset   Hypertension Other    Breast cancer Neg Hx       Prior to Admission medications   Medication Sig Start Date End Date Taking? Authorizing Provider  dicyclomine (BENTYL) 20 MG tablet Take 1 tablet (20 mg total) by mouth every 8 (eight) hours as needed for  spasms (Abdominal cramping). 03/13/21  Yes Ward, Kristen N, DO  ondansetron (ZOFRAN ODT) 4 MG disintegrating tablet Take 1 tablet (4 mg total) by mouth every 6 (six) hours as needed for nausea or vomiting. 03/13/21  Yes Ward, Delice Bison, DO  alendronate (FOSAMAX) 70 MG tablet Take 70 mg by mouth every Monday. Take on a empty stomach with 6-8 oz of liquid.  Take on Mondays. 12/25/15   [provider]  cloNIDine (CATAPRES) 0.2 MG tablet Take 0.2 mg by mouth 2 (two) times daily.  03/04/19    [provider]  doxazosin (CARDURA) 2 MG tablet Take 2 mg by mouth every evening.  02/21/19   [provider]  hydrALAZINE (APRESOLINE) 25 MG tablet Take 25 mg by mouth 3 (three) times daily.  03/28/19   [provider]  levothyroxine (SYNTHROID) 75 MCG tablet Take 75 mcg by mouth daily before breakfast.    [provider]  lisinopril (PRINIVIL,ZESTRIL) 20 MG tablet Take 20 mg by mouth 2 (two) times daily. 01/08/16   [provider]  lovastatin (MEVACOR) 20 MG tablet Take 20 mg by mouth daily with supper.  01/08/16   [provider]  metoprolol tartrate (LOPRESSOR) 25 MG tablet Take 25 mg by mouth 2 (two) times daily.  04/09/19   [provider]  omeprazole (PRILOSEC) 20 MG capsule Take 20 mg by mouth daily. 01/08/16   [provider]    Physical Exam: Vitals:   03/13/21 1155 03/13/21 1205 03/13/21 1220 03/13/21 1225  BP: (!) 86/40 (!) 89/42 (!) 90/42 (!) 87/47  Pulse: (!) 58 (!) 57 (!) 55 66  Resp: 17 12 16 11   Temp:      SpO2: 98% 100% 95% 96%     Vitals:   03/13/21 1155 03/13/21 1205 03/13/21 1220 03/13/21 1225  BP: (!) 86/40 (!) 89/42 (!) 90/42 (!) 87/47  Pulse: (!) 58 (!) 57 (!) 55 66  Resp: 17 12 16 11   Temp:      SpO2: 98% 100% 95% 96%      Constitutional: Alert and oriented x 3 . Not in any apparent distress HEENT:      Head: Normocephalic and atraumatic.         Eyes: PERLA, EOMI, Conjunctivae are normal. Sclera is non-icteric.       Mouth/Throat: Mucous membranes are moist.       Neck: Supple with no signs of meningismus. Cardiovascular: Regular rate and rhythm. No murmurs, gallops, or rubs. 2+ symmetrical distal pulses are present . No JVD. No LE edema Respiratory: Respiratory effort normal .Lungs sounds clear bilaterally. No wheezes, crackles, or rhonchi.  Gastrointestinal: Soft, non tender, and non distended with positive bowel sounds.  Genitourinary: No CVA tenderness. Musculoskeletal: Nontender  with normal range of motion in all extremities. No cyanosis, or erythema of extremities. Neurologic:  Face is symmetric. Moving all extremities. No gross focal neurologic deficits . Skin: Skin is warm, dry.  No rash or ulcers Psychiatric: Mood and affect are normal    Labs on Admission: I have personally reviewed following labs and imaging studies  CBC: Recent Labs  Lab 03/13/21 0125 03/13/21 1220  WBC 8.4 5.6  NEUTROABS  --  3.8  HGB 14.3 9.4*  HCT 43.6 28.6*  MCV 83.5 84.4  PLT 317 676   Basic Metabolic Panel: Recent Labs  Lab 03/13/21 0125  NA 137  K 3.9  CL 106  CO2 21*  GLUCOSE 193*  BUN 16  CREATININE 1.67*  CALCIUM 8.7*   GFR: CrCl cannot be calculated (Unknown ideal weight.). Liver Function Tests: Recent Labs  Lab 03/13/21 0125  AST 22  ALT 15  ALKPHOS 65  BILITOT 1.3*  PROT 7.3  ALBUMIN 3.8   Recent Labs  Lab 03/13/21 0125  LIPASE 28   No results for input(s): AMMONIA in the last 168 hours. Coagulation Profile: No results for input(s): INR, PROTIME in the last 168 hours. Cardiac Enzymes: No results for input(s): CKTOTAL, CKMB, CKMBINDEX, TROPONINI in the last 168 hours. BNP (last 3 results) No results for input(s): PROBNP in the last 8760 hours. HbA1C: No results for input(s): HGBA1C in the last 72 hours. CBG: No results for input(s): GLUCAP in the last 168 hours. Lipid Profile: No results for input(s): CHOL, HDL, LDLCALC, TRIG, CHOLHDL, LDLDIRECT in the last 72 hours. Thyroid Function Tests: No results for input(s): TSH, T4TOTAL, FREET4, T3FREE, THYROIDAB in the last 72 hours. Anemia Panel: No results for input(s): VITAMINB12, FOLATE, FERRITIN, TIBC, IRON, RETICCTPCT in the last 72 hours. Urine analysis:    Component Value Date/Time   COLORURINE YELLOW (A) 03/13/2021 0740   APPEARANCEUR HAZY (A) 03/13/2021 0740   APPEARANCEUR Hazy 08/31/2012 2123   LABSPEC 1.012 03/13/2021 0740   LABSPEC 1.021 08/31/2012 2123   PHURINE 5.0  03/13/2021 0740   GLUCOSEU NEGATIVE 03/13/2021 0740   GLUCOSEU Negative 08/31/2012 2123   HGBUR NEGATIVE 03/13/2021 0740   BILIRUBINUR NEGATIVE 03/13/2021 0740   BILIRUBINUR Negative 08/31/2012 2123   KETONESUR 20 (A) 03/13/2021 0740   PROTEINUR 30 (A) 03/13/2021 0740   NITRITE NEGATIVE 03/13/2021 0740   LEUKOCYTESUR SMALL (A) 03/13/2021 0740   LEUKOCYTESUR 1+ 08/31/2012 2123    Radiological Exams on Admission: CT ABDOMEN PELVIS WO CONTRAST  Result Date: 03/13/2021 CLINICAL DATA:  Acute nonlocalized abdominal pain. Episode of diarrhea. EXAM: CT ABDOMEN AND PELVIS WITHOUT CONTRAST TECHNIQUE: Multidetector CT imaging of the abdomen and pelvis was performed following the standard protocol without IV contrast. COMPARISON:  11/30/2020 FINDINGS: Lower chest: Small volume low-density pericardial effusion, chronic. Mild scarring at the lung bases. Right mastectomy. Hepatobiliary: No focal liver abnormality.No evidence of biliary obstruction or stone. Pancreas: Unremarkable. Spleen: Prior injury with scarring and calcification superiorly. Adrenals/Urinary Tract: Negative adrenals. No hydronephrosis or stone. Unremarkable bladder. Stomach/Bowel: Ileal wall thickening in the pelvis and right lower quadrant with mesenteric fat stranding. No appendicitis or colonic thickening. No obstruction. Vascular/Lymphatic: Multifocal atheromatous plaque. No mass or adenopathy. Reproductive:No pathologic findings. Other: Small volume ascites spanning pelvis to diaphragm, presumably reactive. Musculoskeletal: Severe lower thoracic disc degeneration. Severe lumbar facet osteoarthritis with L4-5 anterolisthesis. L3-4 and L4-5 advanced spinal stenosis. IMPRESSION: 1. Ileitis without specific feature. 2. Small volume ascites presumably reactive to #1. 3.  Aortic Atherosclerosis (ICD10-I70.0). Electronically Signed   By: Monte Fantasia M.D.   On: 03/13/2021 06:21     Assessment/Plan Principal Problem:   Hypotension due to  drugs Active Problems:   Benign essential hypertension   CKD stage 3 secondary to diabetes (HCC)   GERD (gastroesophageal reflux disease)   Hypothyroidism due to acquired atrophy of thyroid   Ileitis      Hypotension due to drugs Patient developed asymptomatic hypotension following ingestion of multiple antihypertensive medications which include clonidine 0.3 mg, metoprolol 25 mg, lisinopril 20 mg and hydralazine 50 mg Hold all antihypertensive medications for now Continue IV fluid resuscitation Patient will need adjustment in her antihypertensive medications prior to discharge.    Diabetes mellitus with complications of stage III chronic kidney disease  Will place patient on consistent carbohydrate diet Patient has a slight bump in her creatinine from baseline, at baseline serum creatinine is 1.2 on admission today it is 1.6 Gentle IV fluid hydration Glycemic control with sliding scale insulin    GERD Continue PPI   Hypothyroidism Continue Synthroid   Ileitis Supportive care Continue Bentyl with meals   DVT prophylaxis: Lovenox  Code Status: full code  Family Communication: Greater than 50% of time was spent discussing patient's condition and plan of care with her and her son at the bedside.  All questions and concerns have been addressed.  They verbalized understanding and agree with the plan. Disposition Plan: Back to previous home environment Consults called: none  Status: Observation    Kynslie Ringle MD [facility]     03/13/2021, 12:40 PM

## 2021-03-13 NOTE — Discharge Instructions (Addendum)
You may take over-the-counter Imodium as needed for diarrhea.  You may take over-the-counter Tylenol 1000 mg every 6 hours as needed for pain, fever.  Please return for any worsening.  This includes developing a fever or worse pain being unable to keep down fluids or having return of diarrhea.

## 2021-03-13 NOTE — ED Notes (Signed)
Meal tray provided.

## 2021-03-13 NOTE — ED Provider Notes (Addendum)
[facility] Emergency Department Provider Note  ____________________________________________   Event Date/Time   First MD Initiated Contact with Patient 03/13/21 0425     (approximate)  I have reviewed the triage vital signs and the nursing notes.   HISTORY  Chief Complaint Abdominal Pain    [name] is a 80 y.o. female with history of hypertension, breast cancer, chronic kidney disease, diabetes, hyperlipidemia, polymyalgia rheumatica who presents to the emergency department with diffuse, severe, crampy abdominal pain that has been ongoing for the past day.  Has had nausea, vomiting and diarrhea.  No dysuria, hematuria, vaginal bleeding or discharge.  Denies any previous abdominal surgeries.  No known sick contacts.  Denies chest pain or shortness of breath.  No aggravating or alleviating factors.  No known fever.  Reports decreased appetite today.  No recent travel.  No history of inflammatory bowel disease.        Past Medical History:  Diagnosis Date   Arthritis    Breast cancer ([place]) 2002   Right breast ca with chemo and rad tx. Left breast ca 1995 with rad tx.    Fibromyalgia    Hypertension    IBS (irritable bowel syndrome)     Patient Active Problem List   Diagnosis Date Noted   Malignant hypertension 05/09/2019   CRF (chronic renal failure), stage 3 (moderate) ([place]) 07/14/2017   Controlled type 2 diabetes mellitus without complication, without long-term current use of insulin ([place]) 01/08/2017   Syncope 01/26/2016   Benign essential hypertension 06/19/2014   Hypercholesterolemia 06/19/2014   Hypothyroidism due to acquired atrophy of thyroid 06/19/2014   PMR (polymyalgia rheumatica) ([place]) 06/19/2014   GERD (gastroesophageal reflux disease) 12/08/2013    Past Surgical History:  Procedure Laterality Date   BREAST EXCISIONAL BIOPSY Left 1995   postive for breast ca. lumpectomy and rad tx   MASTECTOMY Right 2002   Breast ca    RENAL ANGIOGRAPHY N/A 05/13/2019   Procedure: RENAL ANGIOGRAPHY;  Surgeon: Katha Cabal, MD;  Location: [facility];  Service: Cardiovascular;  Laterality: N/A;    Prior to Admission medications   Medication Sig Start Date End Date Taking? Authorizing Provider  alendronate (FOSAMAX) 70 MG tablet Take 70 mg by mouth every Monday. Take on a empty stomach with 6-8 oz of liquid.  Take on Mondays. 12/25/15   [provider]  cloNIDine (CATAPRES) 0.2 MG tablet Take 0.2 mg by mouth 2 (two) times daily.  03/04/19   [provider]  doxazosin (CARDURA) 2 MG tablet Take 2 mg by mouth every evening.  02/21/19   [provider]  hydrALAZINE (APRESOLINE) 25 MG tablet Take 25 mg by mouth 3 (three) times daily.  03/28/19   [provider]  levothyroxine (SYNTHROID) 75 MCG tablet Take 75 mcg by mouth daily before breakfast.    [provider]  lisinopril (PRINIVIL,ZESTRIL) 20 MG tablet Take 20 mg by mouth 2 (two) times daily. 01/08/16   [provider]  lovastatin (MEVACOR) 20 MG tablet Take 20 mg by mouth daily with supper.  01/08/16   [provider]  metoprolol tartrate (LOPRESSOR) 25 MG tablet Take 25 mg by mouth 2 (two) times daily.  04/09/19   [provider]  omeprazole (PRILOSEC) 20 MG capsule Take 20 mg by mouth daily. 01/08/16   [provider]  ondansetron (ZOFRAN ODT) 4 MG disintegrating tablet Take 1 tablet (4 mg total) by mouth every 8 (eight) hours as needed  for nausea or vomiting. 11/30/20   Vanessa [place], MD    Allergies Hydrocodone and Tramadol  Family History  Problem Relation Age of Onset   Hypertension Other    Breast cancer Neg Hx     Social History Social History   Tobacco Use   Smoking status: Former    Pack years: 0.00   Smokeless tobacco: Never  Substance Use Topics   Alcohol use: No    Alcohol/week: 0.0 standard drinks   Drug use: Never    Review of Systems Constitutional: No  fever. Eyes: No visual changes. ENT: No sore throat. Cardiovascular: Denies chest pain. Respiratory: Denies shortness of breath. Gastrointestinal: + nausea, vomiting, diarrhea. Genitourinary: Negative for dysuria. Musculoskeletal: Negative for back pain. Skin: Negative for rash. Neurological: Negative for focal weakness or numbness.  ____________________________________________   PHYSICAL EXAM:  VITAL SIGNS: ED Triage Vitals  Enc Vitals Group     BP 03/13/21 0102 (!) 149/81     Pulse Rate 03/13/21 0102 (!) 102     Resp 03/13/21 0102 18     Temp 03/13/21 0102 98.4 F (36.9 C)     Temp src --      SpO2 03/13/21 0102 98 %     Weight --      Height --      Head Circumference --      Peak Flow --      Pain Score 03/13/21 0103 9     Pain Loc --      Pain Edu? --      Excl. in [place]? --    CONSTITUTIONAL: Alert and oriented and responds appropriately to questions. Well-appearing; well-nourished HEAD: Normocephalic EYES: Conjunctivae clear, pupils appear equal, EOM appear intact ENT: normal nose; moist mucous membranes NECK: Supple, normal ROM CARD: RRR; S1 and S2 appreciated; no murmurs, no clicks, no rubs, no gallops RESP: Normal chest excursion without splinting or tachypnea; breath sounds clear and equal bilaterally; no wheezes, no rhonchi, no rales, no hypoxia or respiratory distress, speaking full sentences ABD/GI: Normal bowel sounds; non-distended; soft, diffusely tender throughout the abdomen, no rebound, no guarding, no peritoneal signs, no hepatosplenomegaly BACK: The back appears normal EXT: Normal ROM in all joints; no deformity noted, no edema; no cyanosis SKIN: Normal color for age and race; warm; no rash on exposed skin NEURO: Moves all extremities equally PSYCH: The patient's mood and manner are appropriate.  ____________________________________________   LABS (all labs ordered are listed, but only abnormal results are displayed)  Labs Reviewed   COMPREHENSIVE METABOLIC PANEL - Abnormal; Notable for the following components:      Result Value   CO2 21 (*)    Glucose, Bld 193 (*)    Creatinine, Ser 1.67 (*)    Calcium 8.7 (*)    Total Bilirubin 1.3 (*)    GFR, Estimated 31 (*)    All other components within normal limits  CBC - Abnormal; Notable for the following components:   RBC 5.22 (*)    All other components within normal limits  GASTROINTESTINAL PANEL BY PCR, STOOL (REPLACES STOOL CULTURE)  C DIFFICILE QUICK SCREEN W PCR REFLEX    LIPASE, BLOOD  URINALYSIS, COMPLETE (UACMP) WITH MICROSCOPIC  LACTIC ACID, PLASMA  TROPONIN I (HIGH SENSITIVITY)   ____________________________________________  EKG   EKG Interpretation  Date/Time:  Wednesday March 13 2021 01:10:11 EDT Ventricular Rate:  102 PR Interval:  134 QRS Duration: 72 QT Interval:  340 QTC Calculation: 443 R Axis:  8 Text Interpretation: Sinus tachycardia Possible Left atrial enlargement Borderline ECG Confirmed by Pryor Curia [phone]) on 03/13/2021 4:30:18 AM         ____________________________________________  RADIOLOGY Jessie Foot Maca[place]y [place], personally viewed and evaluated these images (plain radiographs) as part of my medical decision making, as well as reviewing the written report by the radiologist.  ED MD interpretation: Ileitis.  Official radiology report(s): CT ABDOMEN PELVIS WO CONTRAST  Result Date: 03/13/2021 CLINICAL DATA:  Acute nonlocalized abdominal pain. Episode of diarrhea. EXAM: CT ABDOMEN AND PELVIS WITHOUT CONTRAST TECHNIQUE: Multidetector CT imaging of the abdomen and pelvis was performed following the standard protocol without IV contrast. COMPARISON:  11/30/2020 FINDINGS: Lower chest: Small volume low-density pericardial effusion, chronic. Mild scarring at the lung bases. Right mastectomy. Hepatobiliary: No focal liver abnormality.No evidence of biliary obstruction or stone. Pancreas: Unremarkable. Spleen: Prior injury with scarring  and calcification superiorly. Adrenals/Urinary Tract: Negative adrenals. No hydronephrosis or stone. Unremarkable bladder. Stomach/Bowel: Ileal wall thickening in the pelvis and right lower quadrant with mesenteric fat stranding. No appendicitis or colonic thickening. No obstruction. Vascular/Lymphatic: Multifocal atheromatous plaque. No mass or adenopathy. Reproductive:No pathologic findings. Other: Small volume ascites spanning pelvis to diaphragm, presumably reactive. Musculoskeletal: Severe lower thoracic disc degeneration. Severe lumbar facet osteoarthritis with L4-5 anterolisthesis. L3-4 and L4-5 advanced spinal stenosis. IMPRESSION: 1. Ileitis without specific feature. 2. Small volume ascites presumably reactive to #1. 3.  Aortic Atherosclerosis (ICD10-I70.0). Electronically Signed   By: Monte Fantasia M.D.   On: 03/13/2021 06:21    ____________________________________________   PROCEDURES  Procedure(s) performed (including Critical Care):  Procedures    ____________________________________________   INITIAL IMPRESSION / ASSESSMENT AND PLAN / ED COURSE  As part of my medical decision making, I reviewed the following data within the [place] History obtained from family, Nursing notes reviewed and incorporated, Labs reviewed , Old chart reviewed, Patient signed out to , Radiograph reviewed , and Notes from prior ED visits, EKG reviewed       Patient with complaints of diffuse abdominal pain, vomiting and diarrhea.  Differential includes viral gastroenteritis, gastritis, cholecystitis, pancreatitis, UTI, pyelonephritis, kidney stone, colitis, bowel obstruction, diverticulitis, appendicitis.  Labs obtained in triage show no significant abnormality other than chronic kidney disease.  Troponin normal.  EKG nonischemic.  She denies any chest pain, shortness of breath.  Doubt ACS, PE or dissection.  We will proceed with CT imaging of the chest.  Will obtain urinalysis.   Will give IV fluids, pain and nausea medicine.  ED PROGRESS  CT scan shows ileitis.  Discussed with patient that this is likely a self-limiting infection and I do not necessarily feel she needs to be on antibiotics at this time but have recommended close follow-up with gastroenterology.  Will obtain lactic to ensure that this not ischemic in nature.  She has no history of inflammatory bowel disease.  Urine, stool studies pending.  Signed out to Dr. Cinda Quest to follow-up on further test results.  She reports feeling better and has been able to tolerate p.o. here.  Son at bedside is also comfortable with this plan.   7:40 AM  Pt's lactic is normal.  Patient now having increasing pain again.  Will redose fentanyl as well as Bentyl.  Urinalysis, stool studies pending.  I reviewed all nursing notes and pertinent previous records as available.  I have reviewed and interpreted any EKGs, lab and urine results, imaging (as available).  ____________________________________________   FINAL CLINICAL IMPRESSION(S) / ED DIAGNOSES  Final diagnoses:  Generalized abdominal pain  Nausea vomiting and diarrhea  Ileitis     ED Discharge Orders     None       *Please note:  Janice Galloway was evaluated in Emergency Department on 03/13/2021 for the symptoms described in the history of present illness. She was evaluated in the context of the global COVID-19 pandemic, which necessitated consideration that the patient might be at risk for infection with the SARS-CoV-2 virus that causes COVID-19. Institutional protocols and algorithms that pertain to the evaluation of patients at risk for COVID-19 are in a state of rapid change based on information released by regulatory bodies including the CDC and federal and state organizations. These policies and algorithms were followed during the patient's care in the ED.  Some ED evaluations and interventions may be delayed as a result of limited staffing during and the  pandemic.*   Note:  This document was prepared using Dragon voice recognition software and may include unintentional dictation errors.    Sarrah Fiorenza, Delice Bison, DO 03/13/21 4034    Tiffani Kadow, Delice Bison, DO 03/13/21 7425

## 2021-03-13 NOTE — ED Triage Notes (Signed)
Pt reporting upper abdominal cramping pain that started this afternoon around 2 pm. Diarrhea x 2. Denies fevers. No urinary symptoms.

## 2021-03-13 NOTE — ED Triage Notes (Signed)
Arrives via [facility] from home. Per their report pt has had Upper abdominal pain since around 1600 today. ook gas relief meds without relief. 1 episode of diarrhea. 176/82, hr 70's 100% RA,cbg 234

## 2021-03-13 NOTE — ED Provider Notes (Addendum)
At 1020 patient is feeling better.  She has tolerated p.o. food and liquid.  Her lab work is okay.  She is not having any diarrhea or producing any stool any longer to send a stool specimen.  I have discussed the case including the CT scan with Dr. Haig Prophet who is on-call now for gastroenterology.  He will follow up with the patient.  She will return here if she has any worsening including worsening pain fever return of vomiting or severe diarrhea.  Patient was offered admission several times by myself but she really wants to go home.  Her son is very involved with her care and also suggested to her that she should stay in the hospital but again she wants to go home as she appears to be better and and the plan by Dr. Leonides Schanz was to discharge her I will allow her to go home.  Patient and her son realized that she is older and we have to be very careful and it would probably be safer to admit her but she does not want to stay.   Nena Polio, MD 03/13/21 1020 At 1045 patient's blood pressure is in the 42D systolic 3 different times.  We will start some IV fluids.  Patient is not in any condition to go home at this point.  We will have to at least observe her overnight.  I believe I will repeat several lab work and make sure there is no change.   Nena Polio, MD 03/13/21 1049

## 2021-03-14 ENCOUNTER — Encounter: Payer: Self-pay | Admitting: Internal Medicine

## 2021-03-14 DIAGNOSIS — N183 Chronic kidney disease, stage 3 unspecified: Secondary | ICD-10-CM | POA: Diagnosis not present

## 2021-03-14 DIAGNOSIS — E1122 Type 2 diabetes mellitus with diabetic chronic kidney disease: Secondary | ICD-10-CM | POA: Diagnosis not present

## 2021-03-14 DIAGNOSIS — I952 Hypotension due to drugs: Secondary | ICD-10-CM | POA: Diagnosis not present

## 2021-03-14 DIAGNOSIS — I10 Essential (primary) hypertension: Secondary | ICD-10-CM | POA: Diagnosis not present

## 2021-03-14 DIAGNOSIS — E034 Atrophy of thyroid (acquired): Secondary | ICD-10-CM | POA: Diagnosis not present

## 2021-03-14 LAB — HEMOGLOBIN A1C
Hgb A1c MFr Bld: 7 % — ABNORMAL HIGH (ref 4.8–5.6)
Mean Plasma Glucose: 154 mg/dL

## 2021-03-14 LAB — CBC
HCT: 30.9 % — ABNORMAL LOW (ref 36.0–46.0)
Hemoglobin: 10 g/dL — ABNORMAL LOW (ref 12.0–15.0)
MCH: 28.2 pg (ref 26.0–34.0)
MCHC: 32.4 g/dL (ref 30.0–36.0)
MCV: 87 fL (ref 80.0–100.0)
Platelets: 209 10*3/uL (ref 150–400)
RBC: 3.55 MIL/uL — ABNORMAL LOW (ref 3.87–5.11)
RDW: 14 % (ref 11.5–15.5)
WBC: 4.2 10*3/uL (ref 4.0–10.5)
nRBC: 0 % (ref 0.0–0.2)

## 2021-03-14 LAB — GLUCOSE, CAPILLARY
Glucose-Capillary: 76 mg/dL (ref 70–99)
Glucose-Capillary: 92 mg/dL (ref 70–99)
Glucose-Capillary: 122 mg/dL — ABNORMAL HIGH (ref 70–99)
Glucose-Capillary: 131 mg/dL — ABNORMAL HIGH (ref 70–99)

## 2021-03-14 LAB — BASIC METABOLIC PANEL
Anion gap: 7 (ref 5–15)
BUN: 14 mg/dL (ref 8–23)
CO2: 23 mmol/L (ref 22–32)
Calcium: 7.9 mg/dL — ABNORMAL LOW (ref 8.9–10.3)
Chloride: 112 mmol/L — ABNORMAL HIGH (ref 98–111)
Creatinine, Ser: 1.44 mg/dL — ABNORMAL HIGH (ref 0.44–1.00)
GFR, Estimated: 37 mL/min — ABNORMAL LOW (ref >60)
Glucose, Bld: 84 mg/dL (ref 70–99)
Potassium: 3.8 mmol/L (ref 3.5–5.1)
Sodium: 142 mmol/L (ref 135–145)

## 2021-03-14 MED ORDER — LISINOPRIL 20 MG PO TABS
20.0000 mg | ORAL_TABLET | Freq: Every day | ORAL | Status: DC
  Administered 2021-03-14 – 2021-03-15 (×2): 20 mg via ORAL
  Filled 2021-03-14 (×2): qty 1

## 2021-03-14 MED ORDER — DOXAZOSIN MESYLATE 4 MG PO TABS
ORAL_TABLET | ORAL | Status: DC
Start: 2021-03-14 — End: 2021-03-15

## 2021-03-14 MED ORDER — HYDRALAZINE HCL 50 MG PO TABS
50.0000 mg | ORAL_TABLET | Freq: Three times a day (TID) | ORAL | Status: DC
  Administered 2021-03-14 – 2021-03-15 (×4): 50 mg via ORAL
  Filled 2021-03-14 (×4): qty 1

## 2021-03-14 MED ORDER — LISINOPRIL 20 MG PO TABS: 20.0000 mg | ORAL_TABLET | Freq: Every day | ORAL | 0 refills | Status: AC

## 2021-03-14 MED ORDER — METOPROLOL TARTRATE 25 MG PO TABS
25.0000 mg | ORAL_TABLET | Freq: Two times a day (BID) | ORAL | Status: DC
  Administered 2021-03-14 – 2021-03-15 (×3): 25 mg via ORAL
  Filled 2021-03-14 (×3): qty 1

## 2021-03-14 MED ORDER — DOXAZOSIN MESYLATE 4 MG PO TABS
4.0000 mg | ORAL_TABLET | Freq: Every evening | ORAL | Status: DC
  Administered 2021-03-14: 4 mg via ORAL
  Filled 2021-03-14 (×2): qty 1

## 2021-03-14 NOTE — Plan of Care (Signed)

## 2021-03-14 NOTE — Progress Notes (Signed)
PROGRESS NOTE    Janice Galloway  WVP:[id number] DOB: 09/04/1941 DOA: 03/13/2021 PCP: Baxter Hire, MD  [id number]   Assessment & Plan:   Principal Problem:   Hypotension due to drugs Active Problems:   Benign essential hypertension   CKD stage 3 secondary to diabetes ([place])   GERD (gastroesophageal reflux disease)   Hypothyroidism due to acquired atrophy of thyroid   Ileitis   Janice Galloway is a 80 y.o. 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  She describes the pain as diffuse, crampy and severe rated 8 x 10 in intensity at its worst.  Pain is nonradiating and is associated with nausea, vomiting and an episode of diarrhea.   Ileitis --ED provider discussed with oncall GI Dr. Haig Prophet, who rec outpatient followup. Plan: Supportive care --cont Bentyl   Hypotension due to BP meds Hx of HTN Patient developed asymptomatic hypotension following ingestion of multiple antihypertensive medications which include clonidine 0.3 mg, metoprolol 25 mg, lisinopril 20 mg and hydralazine 50 mg --Pt had noted BP in 90's at home as well. --Home BP meds held on admission, however, BP trended up to >190's this morning Plan: --d/c IVF --resume home metop, lisinopril as 20 mg daily, hydralazine and Cardura  --Hold clonidine   Diabetes mellitus  --SSI  stage III chronic kidney disease --Cr at baseline --d/c MIVF   GERD --cont PPI     Hypothyroidism --cont Synthroid    DVT prophylaxis: Lovenox SQ Code Status: Full code  Family Communication: son updated at bedside today Level of care: Progressive Cardiac Dispo:   The patient is from: home Anticipated d/c is to: home Anticipated d/c date is: likely  tomorrow Patient currently is not medically ready to d/c due to: systolic >546'E   Subjective and Interval History:  No more abdominal pain.  Tolerating diet, wanting to go home.  Home BP meds held on admission due to severe hypotension.  BP went up to >190's this morning, so 3 of 5 home BP meds resumed, however, by the end of the day, BP still >190's.   Objective: Vitals:   03/14/21 0754 03/14/21 1147 03/14/21 1552 03/14/21 1915  BP: (!) 150/58 (!) 193/90 (!) 184/74 (!) 197/90  Pulse: 70 68 72 93  Resp: 19 19 19 15   Temp: 97.8 F (36.6 C) 97.7 F (36.5 C) 98.3 F (36.8 C) 98.2 F (36.8 C)  TempSrc: Oral Oral Oral   SpO2: 98% 100% 100% 99%  Weight:        Intake/Output Summary (Last 24 hours) at 03/14/2021 1930 Last data filed at 03/14/2021 1830 Gross per 24 hour  Intake 120 ml  Output --  Net 120 ml   Filed Weights   03/13/21 1430  Weight: 59.9 kg    Examination:   Constitutional: NAD, AAOx3 HEENT: conjunctivae and lids normal, EOMI CV: No cyanosis.   RESP: normal respiratory effort, on RA Extremities: No effusions, edema in BLE SKIN: warm, dry Neuro: II - XII grossly intact.   Psych: Normal mood and affect.  Appropriate judgement and reason   Data Reviewed: I have personally reviewed following labs and imaging studies  CBC: Recent Labs  Lab 03/13/21 0125 03/13/21 1220 03/14/21 0600  WBC 8.4 5.6 4.2  NEUTROABS  --  3.8  --   HGB 14.3 9.4* 10.0*  HCT 43.6 28.6* 30.9*  MCV 83.5 84.4 87.0  PLT 317 209 778   Basic Metabolic Panel: Recent Labs  Lab 03/13/21 0125 03/14/21 0600  NA 137 142  K 3.9 3.8  CL 106 112*  CO2 21* 23  GLUCOSE 193* 84  BUN 16 14  CREATININE 1.67* 1.44*  CALCIUM 8.7* 7.9*   GFR: Estimated Creatinine Clearance: 28 mL/min (A) (by C-G formula based on SCr of 1.44 mg/dL (H)). Liver Function Tests: Recent Labs  Lab 03/13/21 0125  AST 22  ALT 15  ALKPHOS 65  BILITOT 1.3*  PROT 7.3  ALBUMIN 3.8   Recent Labs  Lab  03/13/21 0125  LIPASE 28   No results for input(s): AMMONIA in the last 168 hours. Coagulation Profile: No results for input(s): INR, PROTIME in the last 168 hours. Cardiac Enzymes: No results for input(s): CKTOTAL, CKMB, CKMBINDEX, TROPONINI in the last 168 hours. BNP (last 3 results) No results for input(s): PROBNP in the last 8760 hours. HbA1C: Recent Labs    03/13/21 1257  HGBA1C 7.0*   CBG: Recent Labs  Lab 03/13/21 1644 03/14/21 0755 03/14/21 1140 03/14/21 1613 03/14/21 1916  GLUCAP 93 76 92 122* 131*   Lipid Profile: No results for input(s): CHOL, HDL, LDLCALC, TRIG, CHOLHDL, LDLDIRECT in the last 72 hours. Thyroid Function Tests: No results for input(s): TSH, T4TOTAL, FREET4, T3FREE, THYROIDAB in the last 72 hours. Anemia Panel: No results for input(s): VITAMINB12, FOLATE, FERRITIN, TIBC, IRON, RETICCTPCT in the last 72 hours. Sepsis Labs: Recent Labs  Lab 03/13/21 0650  LATICACIDVEN 1.8    Recent Results (from the past 240 hour(s))  Resp Panel by RT-PCR (Flu A&B, Covid) Nasopharyngeal Swab     Status: None   Collection Time: 03/13/21 11:01 AM   Specimen: Nasopharyngeal Swab; Nasopharyngeal(NP) swabs in vial transport medium  Result Value Ref Range Status   SARS Coronavirus 2 by RT PCR NEGATIVE NEGATIVE Final    Comment: (NOTE) SARS-CoV-2 target nucleic acids are NOT DETECTED.  The SARS-CoV-2 RNA is generally detectable in upper respiratory specimens during the acute phase of infection. The lowest concentration of SARS-CoV-2 viral copies this assay can detect is 138 copies/mL. A negative result does not preclude SARS-Cov-2 infection and should not be used as the sole basis for treatment or other patient management decisions. A negative result may occur with  improper specimen collection/handling, submission of specimen other than nasopharyngeal swab, presence of viral mutation(s) within the areas targeted by this assay, and inadequate number of  viral copies(<138 copies/mL). A negative result must be combined with clinical observations, patient history, and epidemiological information. The expected result is Negative.  Fact Sheet for Patients:  EntrepreneurPulse.com.au  Fact Sheet for Healthcare Providers:  IncredibleEmployment.be  This test is no t yet approved or cleared by the [place] FDA and  has been authorized for detection and/or diagnosis of SARS-CoV-2 by FDA under an Emergency Use Authorization (EUA). This EUA will remain  in effect (meaning this test can be used) for the duration of the COVID-19 declaration under Section 564(b)(1) of the Act, 21 U.S.C.section 360bbb-3(b)(1), unless the authorization is terminated  or revoked sooner.       Influenza A by PCR NEGATIVE NEGATIVE Final   Influenza B by PCR NEGATIVE NEGATIVE Final    Comment: (NOTE) The Xpert Xpress SARS-CoV-2/FLU/RSV plus assay is intended as an aid in the diagnosis of influenza from Nasopharyngeal swab specimens and should not  be used as a sole basis for treatment. Nasal washings and aspirates are unacceptable for Xpert Xpress SARS-CoV-2/FLU/RSV testing.  Fact Sheet for Patients: EntrepreneurPulse.com.au  Fact Sheet for Healthcare Providers: IncredibleEmployment.be  This test is not yet approved or cleared by the [place] FDA and has been authorized for detection and/or diagnosis of SARS-CoV-2 by FDA under an Emergency Use Authorization (EUA). This EUA will remain in effect (meaning this test can be used) for the duration of the COVID-19 declaration under Section 564(b)(1) of the Act, 21 U.S.C. section 360bbb-3(b)(1), unless the authorization is terminated or revoked.  Performed at [facility], [address] [place], [place] [address]       Radiology Studies: CT ABDOMEN PELVIS WO CONTRAST  Result Date: 03/13/2021 CLINICAL DATA:  Acute  nonlocalized abdominal pain. Episode of diarrhea. EXAM: CT ABDOMEN AND PELVIS WITHOUT CONTRAST TECHNIQUE: Multidetector CT imaging of the abdomen and pelvis was performed following the standard protocol without IV contrast. COMPARISON:  11/30/2020 FINDINGS: Lower chest: Small volume low-density pericardial effusion, chronic. Mild scarring at the lung bases. Right mastectomy. Hepatobiliary: No focal liver abnormality.No evidence of biliary obstruction or stone. Pancreas: Unremarkable. Spleen: Prior injury with scarring and calcification superiorly. Adrenals/Urinary Tract: Negative adrenals. No hydronephrosis or stone. Unremarkable bladder. Stomach/Bowel: Ileal wall thickening in the pelvis and right lower quadrant with mesenteric fat stranding. No appendicitis or colonic thickening. No obstruction. Vascular/Lymphatic: Multifocal atheromatous plaque. No mass or adenopathy. Reproductive:No pathologic findings. Other: Small volume ascites spanning pelvis to diaphragm, presumably reactive. Musculoskeletal: Severe lower thoracic disc degeneration. Severe lumbar facet osteoarthritis with L4-5 anterolisthesis. L3-4 and L4-5 advanced spinal stenosis. IMPRESSION: 1. Ileitis without specific feature. 2. Small volume ascites presumably reactive to #1. 3.  Aortic Atherosclerosis (ICD10-I70.0). Electronically Signed   By: Monte Fantasia M.D.   On: 03/13/2021 06:21     Scheduled Meds:  dicyclomine  10 mg Oral TID AC   doxazosin  4 mg Oral QPM   enoxaparin (LOVENOX) injection  30 mg Subcutaneous Q24H   hydrALAZINE  50 mg Oral TID   insulin aspart  0-15 Units Subcutaneous TID WC   levothyroxine  75 mcg Oral Q0600   lisinopril  20 mg Oral Daily   metoprolol tartrate  25 mg Oral BID   pantoprazole  40 mg Oral Daily   pravastatin  20 mg Oral q1800   Continuous Infusions:   LOS: 0 days     Enzo Bi, MD [facility] If 7PM-7AM, please contact night-coverage 03/14/2021, 7:30 PM

## 2021-03-15 DIAGNOSIS — I952 Hypotension due to drugs: Secondary | ICD-10-CM | POA: Diagnosis not present

## 2021-03-15 LAB — GLUCOSE, CAPILLARY
Glucose-Capillary: 104 mg/dL — ABNORMAL HIGH (ref 70–99)
Glucose-Capillary: 114 mg/dL — ABNORMAL HIGH (ref 70–99)

## 2021-03-15 MED ORDER — CLONIDINE HCL 0.1 MG PO TABS
0.2000 mg | ORAL_TABLET | Freq: Two times a day (BID) | ORAL | Status: DC
  Administered 2021-03-15: 0.2 mg via ORAL
  Filled 2021-03-15: qty 2

## 2021-03-15 MED ORDER — DOXAZOSIN MESYLATE 4 MG PO TABS: 4.0000 mg | ORAL_TABLET | Freq: Every evening | ORAL | Status: AC

## 2021-03-15 MED ORDER — CLONIDINE HCL 0.1 MG PO TABS: 0.1000 mg | ORAL_TABLET | Freq: Two times a day (BID) | ORAL | 0 refills | Status: DC

## 2021-03-15 NOTE — Discharge Summary (Signed)
Physician Discharge Summary   Janice Galloway  female DOB: May 29, 1941  MWN:[id number]  PCP: Baxter Hire, MD  Admit date: 03/13/2021 Discharge date: 03/15/2021  Admitted From: home Disposition:  home CODE STATUS: Full code  Discharge Instructions     Discharge instructions   Complete by: As directed    You presented with Ileitis.  Please follow up with GI Dr. Haig Prophet in outpatient clinic.  Your blood pressure dropped too low on all of your home blood pressure medications.  I have decreased your clonidine from 0.3 mg twice a day to 0.1 mg twice a day.  Also decreased your Lisinopril from twice a day to once a day.  Please take the rest of your blood pressure medications as prescribed.  Check your blood pressure at home and follow up with your PCP for further adjustment.   Dr. Enzo Bi [facility] Course:  For full details, please see H&P, progress notes, consult notes and ancillary notes.  Briefly,  Janice Galloway is a 80 y.o. female with medical history significant for hypertension, diabetes mellitus with complications of stage III chronic kidney disease, irritable bowel syndrome who presented to the ER via EMS for evaluation of abdominal pain mostly in the upper abdomen above the umbilicus since around 4 PM the day prior to admission.    She described the pain as diffuse, crampy and severe rated 8 x 10 in intensity at its worst.  Pain was nonradiating and was associated with nausea, vomiting and an episode of diarrhea.   Ileitis --ED provider discussed with oncall GI Dr. Haig Prophet, who rec outpatient followup and supportive care.  Pt was not started on abx. --cont Bentyl    Hypotension due to BP meds Hx of HTN Patient developed asymptomatic hypotension following ingestion of multiple antihypertensive medications which include clonidine, metoprolol, lisinopril, hydralazine and Cardura. --Pt had noted BP in 90's at home as well. --Home BP meds held on  admission, however, BP trended up to >190's the next morning.  Home BP resumed, however, remained elevated until clonidine was given.  Pt was kept an extra day to work out her BP regimen.   --At discharge, clonidine was decreased from 0.3 mg twice a day to 0.1 mg twice a day.  Also decreased Lisinopril from twice a day to once a day.  rest of blood pressure medications continued.   Diabetes mellitus, well controlled --A1c 7.0. SSI while inpatient.  Discharged back on home glipizide.     stage IIIb chronic kidney disease --Cr at baseline  GERD --cont PPI   Hypothyroidism --cont Synthroid   Discharge Diagnoses:  Principal Problem:   Hypotension due to drugs Active Problems:   Benign essential hypertension   CKD stage 3 secondary to diabetes ([facility])   GERD (gastroesophageal reflux disease)   Hypothyroidism due to acquired atrophy of thyroid   Ileitis     Discharge Instructions:  Allergies as of 03/15/2021       Reactions   Hydrocodone    Other reaction(s): Unknown; pt does not remember reaction   Tramadol Nausea Only        Medication List     TAKE these medications    alendronate 70 MG tablet Commonly known as: FOSAMAX Take 70 mg by mouth every Monday.   cloNIDine 0.1 MG tablet Commonly known as: Catapres Take 1 tablet (0.1 mg total) by mouth 2 (two) times daily. Decreased dose from 0.3 mg 2 times  daily. What changed:  medication strength how much to take additional instructions   dicyclomine 20 MG tablet Commonly known as: BENTYL Take 1 tablet (20 mg total) by mouth every 8 (eight) hours as needed for spasms (Abdominal cramping).   doxazosin 4 MG tablet Commonly known as: CARDURA Take 1 tablet (4 mg total) by mouth every evening.   gabapentin 100 MG capsule Commonly known as: NEURONTIN Take 100 mg by mouth 3 (three) times daily.   glipiZIDE 5 MG tablet Commonly known as: GLUCOTROL Take 2.5 mg by mouth daily before breakfast.   hydrALAZINE 50 MG  tablet Commonly known as: APRESOLINE Take 50 mg by mouth 3 (three) times daily.   levothyroxine 88 MCG tablet Commonly known as: SYNTHROID Take 88 mcg by mouth daily before breakfast.   lisinopril 20 MG tablet Commonly known as: ZESTRIL Take 1 tablet (20 mg total) by mouth daily. Decreased from twice a day. What changed:  when to take this additional instructions   lovastatin 20 MG tablet Commonly known as: MEVACOR Take 20 mg by mouth daily with supper.   metoprolol tartrate 25 MG tablet Commonly known as: LOPRESSOR Take 25 mg by mouth 2 (two) times daily.   omeprazole 20 MG capsule Commonly known as: PRILOSEC Take 20 mg by mouth daily.   ondansetron 4 MG disintegrating tablet Commonly known as: Zofran ODT Take 1 tablet (4 mg total) by mouth every 6 (six) hours as needed for nausea or vomiting. What changed: when to take this   Simethicone 180 MG Caps Take 1 capsule by mouth daily as needed (gas or flatulence).         Follow-up Information     Lesly Rubenstein, MD. Schedule an appointment as soon as possible for a visit on 03/21/2021.   Specialty: Gastroenterology Why: Please call Dr. Haig Prophet to schedule an appointment.  I actually discussed your case with him as Dr. Marius Ditch is no longer on-call..@ 9:30am Contact information: [place] [address] [phone]         Baxter Hire, MD Follow up on 03/22/2021.   Specialty: Internal Medicine Why: @ 10:15am Contact information: [place] [address] [phone]                 Allergies  Allergen Reactions   Hydrocodone     Other reaction(s): Unknown; pt does not remember reaction   Tramadol Nausea Only     The results of significant diagnostics from this hospitalization (including imaging, microbiology, ancillary and laboratory) are listed below for reference.   Consultations:   Procedures/Studies: CT ABDOMEN PELVIS WO CONTRAST  Result  Date: 03/13/2021 CLINICAL DATA:  Acute nonlocalized abdominal pain. Episode of diarrhea. EXAM: CT ABDOMEN AND PELVIS WITHOUT CONTRAST TECHNIQUE: Multidetector CT imaging of the abdomen and pelvis was performed following the standard protocol without IV contrast. COMPARISON:  11/30/2020 FINDINGS: Lower chest: Small volume low-density pericardial effusion, chronic. Mild scarring at the lung bases. Right mastectomy. Hepatobiliary: No focal liver abnormality.No evidence of biliary obstruction or stone. Pancreas: Unremarkable. Spleen: Prior injury with scarring and calcification superiorly. Adrenals/Urinary Tract: Negative adrenals. No hydronephrosis or stone. Unremarkable bladder. Stomach/Bowel: Ileal wall thickening in the pelvis and right lower quadrant with mesenteric fat stranding. No appendicitis or colonic thickening. No obstruction. Vascular/Lymphatic: Multifocal atheromatous plaque. No mass or adenopathy. Reproductive:No pathologic findings. Other: Small volume ascites spanning pelvis to diaphragm, presumably reactive. Musculoskeletal: Severe lower thoracic disc degeneration. Severe lumbar facet osteoarthritis with L4-5 anterolisthesis. L3-4  and L4-5 advanced spinal stenosis. IMPRESSION: 1. Ileitis without specific feature. 2. Small volume ascites presumably reactive to #1. 3.  Aortic Atherosclerosis (ICD10-I70.0). Electronically Signed   By: Monte Fantasia M.D.   On: 03/13/2021 06:21      Labs: BNP (last 3 results) No results for input(s): BNP in the last 8760 hours. Basic Metabolic Panel: Recent Labs  Lab 03/13/21 0125 03/14/21 0600  NA 137 142  K 3.9 3.8  CL 106 112*  CO2 21* 23  GLUCOSE 193* 84  BUN 16 14  CREATININE 1.67* 1.44*  CALCIUM 8.7* 7.9*   Liver Function Tests: Recent Labs  Lab 03/13/21 0125  AST 22  ALT 15  ALKPHOS 65  BILITOT 1.3*  PROT 7.3  ALBUMIN 3.8   Recent Labs  Lab 03/13/21 0125  LIPASE 28   No results for input(s): AMMONIA in the last 168  hours. CBC: Recent Labs  Lab 03/13/21 0125 03/13/21 1220 03/14/21 0600  WBC 8.4 5.6 4.2  NEUTROABS  --  3.8  --   HGB 14.3 9.4* 10.0*  HCT 43.6 28.6* 30.9*  MCV 83.5 84.4 87.0  PLT 317 209 209   Cardiac Enzymes: No results for input(s): CKTOTAL, CKMB, CKMBINDEX, TROPONINI in the last 168 hours. BNP: Invalid input(s): POCBNP CBG: Recent Labs  Lab 03/14/21 1140 03/14/21 1613 03/14/21 1916 03/15/21 0724 03/15/21 1116  GLUCAP 92 122* 131* 104* 114*   D-Dimer No results for input(s): DDIMER in the last 72 hours. Hgb A1c Recent Labs    03/13/21 1257  HGBA1C 7.0*   Lipid Profile No results for input(s): CHOL, HDL, LDLCALC, TRIG, CHOLHDL, LDLDIRECT in the last 72 hours. Thyroid function studies No results for input(s): TSH, T4TOTAL, T3FREE, THYROIDAB in the last 72 hours.  Invalid input(s): FREET3 Anemia work up No results for input(s): VITAMINB12, FOLATE, FERRITIN, TIBC, IRON, RETICCTPCT in the last 72 hours. Urinalysis    Component Value Date/Time   COLORURINE YELLOW (A) 03/13/2021 0740   APPEARANCEUR HAZY (A) 03/13/2021 0740   APPEARANCEUR Hazy 08/31/2012 2123   LABSPEC 1.012 03/13/2021 0740   LABSPEC 1.021 08/31/2012 2123   PHURINE 5.0 03/13/2021 0740   GLUCOSEU NEGATIVE 03/13/2021 0740   GLUCOSEU Negative 08/31/2012 2123   HGBUR NEGATIVE 03/13/2021 0740   BILIRUBINUR NEGATIVE 03/13/2021 0740   BILIRUBINUR Negative 08/31/2012 2123   KETONESUR 20 (A) 03/13/2021 0740   PROTEINUR 30 (A) 03/13/2021 0740   NITRITE NEGATIVE 03/13/2021 0740   LEUKOCYTESUR SMALL (A) 03/13/2021 0740   LEUKOCYTESUR 1+ 08/31/2012 2123   Sepsis Labs Invalid input(s): PROCALCITONIN,  WBC,  LACTICIDVEN Microbiology Recent Results (from the past 240 hour(s))  Resp Panel by RT-PCR (Flu A&B, Covid) Nasopharyngeal Swab     Status: None   Collection Time: 03/13/21 11:01 AM   Specimen: Nasopharyngeal Swab; Nasopharyngeal(NP) swabs in vial transport medium  Result Value Ref Range Status    SARS Coronavirus 2 by RT PCR NEGATIVE NEGATIVE Final    Comment: (NOTE) SARS-CoV-2 target nucleic acids are NOT DETECTED.  The SARS-CoV-2 RNA is generally detectable in upper respiratory specimens during the acute phase of infection. The lowest concentration of SARS-CoV-2 viral copies this assay can detect is 138 copies/mL. A negative result does not preclude SARS-Cov-2 infection and should not be used as the sole basis for treatment or other patient management decisions. A negative result may occur with  improper specimen collection/handling, submission of specimen other than nasopharyngeal swab, presence of viral mutation(s) within the areas targeted by this assay, and inadequate  number of viral copies(<138 copies/mL). A negative result must be combined with clinical observations, patient history, and epidemiological information. The expected result is Negative.  Fact Sheet for Patients:  EntrepreneurPulse.com.au  Fact Sheet for Healthcare Providers:  IncredibleEmployment.be  This test is no t yet approved or cleared by the [place] FDA and  has been authorized for detection and/or diagnosis of SARS-CoV-2 by FDA under an Emergency Use Authorization (EUA). This EUA will remain  in effect (meaning this test can be used) for the duration of the COVID-19 declaration under Section 564(b)(1) of the Act, 21 U.S.C.section 360bbb-3(b)(1), unless the authorization is terminated  or revoked sooner.       Influenza A by PCR NEGATIVE NEGATIVE Final   Influenza B by PCR NEGATIVE NEGATIVE Final    Comment: (NOTE) The Xpert Xpress SARS-CoV-2/FLU/RSV plus assay is intended as an aid in the diagnosis of influenza from Nasopharyngeal swab specimens and should not be used as a sole basis for treatment. Nasal washings and aspirates are unacceptable for Xpert Xpress SARS-CoV-2/FLU/RSV testing.  Fact Sheet for  Patients: EntrepreneurPulse.com.au  Fact Sheet for Healthcare Providers: IncredibleEmployment.be  This test is not yet approved or cleared by the [place] FDA and has been authorized for detection and/or diagnosis of SARS-CoV-2 by FDA under an Emergency Use Authorization (EUA). This EUA will remain in effect (meaning this test can be used) for the duration of the COVID-19 declaration under Section 564(b)(1) of the Act, 21 U.S.C. section 360bbb-3(b)(1), unless the authorization is terminated or revoked.  Performed at [facility], [address] [place], [place] [address]      Total time spend on discharging this patient, including the last patient exam, discussing the hospital stay, instructions for ongoing care as it relates to all pertinent caregivers, as well as preparing the medical discharge records, prescriptions, and/or referrals as applicable, is 40 minutes.    Enzo Bi, MD  [facility] 03/15/2021, 2:19 PM

## 2021-03-21 DIAGNOSIS — K2100 Gastro-esophageal reflux disease with esophagitis, without bleeding: Secondary | ICD-10-CM | POA: Diagnosis not present

## 2021-03-21 DIAGNOSIS — Z9013 Acquired absence of bilateral breasts and nipples: Secondary | ICD-10-CM | POA: Diagnosis not present

## 2021-03-21 DIAGNOSIS — C50111 Malignant neoplasm of central portion of right female breast: Secondary | ICD-10-CM | POA: Diagnosis not present

## 2021-03-21 DIAGNOSIS — R1033 Periumbilical pain: Secondary | ICD-10-CM | POA: Diagnosis not present

## 2021-03-21 DIAGNOSIS — R198 Other specified symptoms and signs involving the digestive system and abdomen: Secondary | ICD-10-CM | POA: Diagnosis not present

## 2021-03-21 DIAGNOSIS — Z9011 Acquired absence of right breast and nipple: Secondary | ICD-10-CM | POA: Diagnosis not present

## 2021-03-21 DIAGNOSIS — C50112 Malignant neoplasm of central portion of left female breast: Secondary | ICD-10-CM | POA: Diagnosis not present

## 2021-03-21 DIAGNOSIS — R933 Abnormal findings on diagnostic imaging of other parts of digestive tract: Secondary | ICD-10-CM | POA: Diagnosis not present

## 2021-03-22 DIAGNOSIS — E034 Atrophy of thyroid (acquired): Secondary | ICD-10-CM | POA: Diagnosis not present

## 2021-03-22 DIAGNOSIS — Z09 Encounter for follow-up examination after completed treatment for conditions other than malignant neoplasm: Secondary | ICD-10-CM | POA: Diagnosis not present

## 2021-03-22 DIAGNOSIS — Z8719 Personal history of other diseases of the digestive system: Secondary | ICD-10-CM | POA: Diagnosis not present

## 2021-03-22 DIAGNOSIS — I129 Hypertensive chronic kidney disease with stage 1 through stage 4 chronic kidney disease, or unspecified chronic kidney disease: Secondary | ICD-10-CM | POA: Diagnosis not present

## 2021-03-22 DIAGNOSIS — N183 Chronic kidney disease, stage 3 unspecified: Secondary | ICD-10-CM | POA: Diagnosis not present

## 2021-03-22 DIAGNOSIS — E1122 Type 2 diabetes mellitus with diabetic chronic kidney disease: Secondary | ICD-10-CM | POA: Diagnosis not present

## 2021-03-26 ENCOUNTER — Other Ambulatory Visit: Payer: Self-pay

## 2021-03-26 ENCOUNTER — Ambulatory Visit
Admission: RE | Admit: 2021-03-26 | Discharge: 2021-03-26 | Disposition: A | Discharge status: Home / Self Care | Payer: Medicare HMO | Source: Ambulatory Visit | Attending: Internal Medicine | Admitting: Internal Medicine

## 2021-03-26 DIAGNOSIS — Z1231 Encounter for screening mammogram for malignant neoplasm of breast: Secondary | ICD-10-CM | POA: Diagnosis not present

## 2021-04-01 ENCOUNTER — Other Ambulatory Visit: Payer: Self-pay | Admitting: Internal Medicine

## 2021-04-01 DIAGNOSIS — R921 Mammographic calcification found on diagnostic imaging of breast: Secondary | ICD-10-CM

## 2021-04-01 DIAGNOSIS — I10 Essential (primary) hypertension: Secondary | ICD-10-CM | POA: Diagnosis not present

## 2021-04-01 DIAGNOSIS — R928 Other abnormal and inconclusive findings on diagnostic imaging of breast: Secondary | ICD-10-CM

## 2021-04-03 ENCOUNTER — Other Ambulatory Visit: Payer: Self-pay

## 2021-04-03 ENCOUNTER — Ambulatory Visit
Admission: RE | Admit: 2021-04-03 | Discharge: 2021-04-03 | Disposition: A | Discharge status: Home / Self Care | Payer: Medicare HMO | Source: Ambulatory Visit | Attending: Internal Medicine | Admitting: Internal Medicine

## 2021-04-03 DIAGNOSIS — R928 Other abnormal and inconclusive findings on diagnostic imaging of breast: Secondary | ICD-10-CM | POA: Diagnosis not present

## 2021-04-03 DIAGNOSIS — R921 Mammographic calcification found on diagnostic imaging of breast: Secondary | ICD-10-CM | POA: Insufficient documentation

## 2021-04-03 DIAGNOSIS — Z853 Personal history of malignant neoplasm of breast: Secondary | ICD-10-CM | POA: Diagnosis not present

## 2021-04-08 DIAGNOSIS — I10 Essential (primary) hypertension: Secondary | ICD-10-CM | POA: Diagnosis not present

## 2021-04-09 ENCOUNTER — Other Ambulatory Visit: Payer: Self-pay | Admitting: Internal Medicine

## 2021-04-09 DIAGNOSIS — R928 Other abnormal and inconclusive findings on diagnostic imaging of breast: Secondary | ICD-10-CM

## 2021-04-09 DIAGNOSIS — R921 Mammographic calcification found on diagnostic imaging of breast: Secondary | ICD-10-CM

## 2021-04-16 ENCOUNTER — Encounter: Payer: Self-pay | Admitting: General Surgery

## 2021-04-17 ENCOUNTER — Ambulatory Visit: Payer: Medicare HMO | Admitting: Anesthesiology

## 2021-04-17 ENCOUNTER — Ambulatory Visit
Admission: RE | Admit: 2021-04-17 | Discharge: 2021-04-17 | Disposition: A | Discharge status: Home / Self Care | Payer: Medicare HMO | Attending: General Surgery | Admitting: General Surgery

## 2021-04-17 ENCOUNTER — Encounter
Admission: RE | Disposition: A | Discharge status: Home / Self Care | Payer: Self-pay | Source: Home / Self Care | Attending: General Surgery

## 2021-04-17 DIAGNOSIS — E7800 Pure hypercholesterolemia, unspecified: Secondary | ICD-10-CM | POA: Diagnosis not present

## 2021-04-17 DIAGNOSIS — K5289 Other specified noninfective gastroenteritis and colitis: Secondary | ICD-10-CM | POA: Insufficient documentation

## 2021-04-17 DIAGNOSIS — K219 Gastro-esophageal reflux disease without esophagitis: Secondary | ICD-10-CM | POA: Insufficient documentation

## 2021-04-17 DIAGNOSIS — Z87891 Personal history of nicotine dependence: Secondary | ICD-10-CM | POA: Insufficient documentation

## 2021-04-17 DIAGNOSIS — Z79899 Other long term (current) drug therapy: Secondary | ICD-10-CM | POA: Diagnosis not present

## 2021-04-17 DIAGNOSIS — Z885 Allergy status to narcotic agent status: Secondary | ICD-10-CM | POA: Diagnosis not present

## 2021-04-17 DIAGNOSIS — Z1211 Encounter for screening for malignant neoplasm of colon: Secondary | ICD-10-CM | POA: Diagnosis not present

## 2021-04-17 DIAGNOSIS — R198 Other specified symptoms and signs involving the digestive system and abdomen: Secondary | ICD-10-CM | POA: Diagnosis not present

## 2021-04-17 DIAGNOSIS — Z888 Allergy status to other drugs, medicaments and biological substances status: Secondary | ICD-10-CM | POA: Insufficient documentation

## 2021-04-17 DIAGNOSIS — Z79890 Hormone replacement therapy (postmenopausal): Secondary | ICD-10-CM | POA: Insufficient documentation

## 2021-04-17 DIAGNOSIS — Z7984 Long term (current) use of oral hypoglycemic drugs: Secondary | ICD-10-CM | POA: Diagnosis not present

## 2021-04-17 DIAGNOSIS — R933 Abnormal findings on diagnostic imaging of other parts of digestive tract: Secondary | ICD-10-CM | POA: Insufficient documentation

## 2021-04-17 DIAGNOSIS — N183 Chronic kidney disease, stage 3 unspecified: Secondary | ICD-10-CM | POA: Insufficient documentation

## 2021-04-17 DIAGNOSIS — D125 Benign neoplasm of sigmoid colon: Secondary | ICD-10-CM | POA: Diagnosis not present

## 2021-04-17 DIAGNOSIS — E1122 Type 2 diabetes mellitus with diabetic chronic kidney disease: Secondary | ICD-10-CM | POA: Insufficient documentation

## 2021-04-17 DIAGNOSIS — I129 Hypertensive chronic kidney disease with stage 1 through stage 4 chronic kidney disease, or unspecified chronic kidney disease: Secondary | ICD-10-CM | POA: Insufficient documentation

## 2021-04-17 DIAGNOSIS — K635 Polyp of colon: Secondary | ICD-10-CM | POA: Diagnosis not present

## 2021-04-17 DIAGNOSIS — R194 Change in bowel habit: Secondary | ICD-10-CM | POA: Diagnosis not present

## 2021-04-17 HISTORY — DX: Hypothyroidism, unspecified: E03.9

## 2021-04-17 HISTORY — DX: Carpal tunnel syndrome, unspecified upper limb: G56.00

## 2021-04-17 HISTORY — DX: Cardiac murmur, unspecified: R01.1

## 2021-04-17 HISTORY — DX: Polymyalgia rheumatica: M35.3

## 2021-04-17 HISTORY — DX: Pure hypercholesterolemia, unspecified: E78.00

## 2021-04-17 HISTORY — DX: Gastro-esophageal reflux disease without esophagitis: K21.9

## 2021-04-17 HISTORY — DX: Chronic kidney disease, unspecified: N18.9

## 2021-04-17 HISTORY — DX: Headache, unspecified: R51.9

## 2021-04-17 HISTORY — PX: COLONOSCOPY WITH PROPOFOL: SHX5780

## 2021-04-17 HISTORY — DX: Unspecified cataract: H26.9

## 2021-04-17 HISTORY — DX: Type 2 diabetes mellitus without complications: E11.9

## 2021-04-17 LAB — GLUCOSE, CAPILLARY: Glucose-Capillary: 116 mg/dL — ABNORMAL HIGH (ref 70–99)

## 2021-04-17 SURGERY — COLONOSCOPY WITH PROPOFOL
Anesthesia: General

## 2021-04-17 MED ORDER — PROPOFOL 10 MG/ML IV BOLUS
INTRAVENOUS | Status: DC | PRN
  Administered 2021-04-17: 20 mg via INTRAVENOUS
  Administered 2021-04-17: 10 mg via INTRAVENOUS
  Administered 2021-04-17: 50 mg via INTRAVENOUS

## 2021-04-17 MED ORDER — EPHEDRINE SULFATE 50 MG/ML IJ SOLN
INTRAMUSCULAR | Status: DC | PRN
  Administered 2021-04-17 (×3): 5 mg via INTRAVENOUS

## 2021-04-17 MED ORDER — PROPOFOL 500 MG/50ML IV EMUL
INTRAVENOUS | Status: DC | PRN
  Administered 2021-04-17: 160 ug/kg/min via INTRAVENOUS

## 2021-04-17 MED ORDER — SODIUM CHLORIDE 0.9 % IV SOLN
INTRAVENOUS | Status: DC
  Administered 2021-04-17: 20 mL/h via INTRAVENOUS

## 2021-04-17 NOTE — Transfer of Care (Signed)
Immediate Anesthesia Transfer of Care Note  Patient: Janice Galloway  Procedure(s) Performed: COLONOSCOPY WITH PROPOFOL  Patient Location: PACU  Anesthesia Type:General  Level of Consciousness: drowsy  Airway & Oxygen Therapy: Patient Spontanous Breathing  Post-op Assessment: Report given to RN and Post -op Vital signs reviewed and stable  Post vital signs: Reviewed and stable  Last Vitals:  Vitals Value Taken Time  BP    Temp    Pulse 75 04/17/21 1037  Resp 18 04/17/21 1037  SpO2 100 % 04/17/21 1037  Vitals shown include unvalidated device data.  Last Pain:  Vitals:   04/17/21 0914  TempSrc: Temporal  PainSc: 0-No pain         Complications: No notable events documented.

## 2021-04-17 NOTE — H&P (Signed)
Janice Galloway YN:[id number] Sep 25, 1940     HPI:  Patient with episodic abdominal pain, presently quiescent. CT scan suggested ileitis, peritoneal fluid, small pericardial effusion.   Colonoscopy to assess for inflammatory bowel disease.   Medications Prior to Admission  Medication Sig Dispense Refill Last Dose   doxazosin (CARDURA) 4 MG tablet Take 1 tablet (4 mg total) by mouth every evening.   04/17/2021 at 0545   gabapentin (NEURONTIN) 100 MG capsule Take 100 mg by mouth 3 (three) times daily.   04/16/2021   glipiZIDE (GLUCOTROL) 5 MG tablet Take 2.5 mg by mouth daily before breakfast.   04/16/2021   hydrALAZINE (APRESOLINE) 50 MG tablet Take 50 mg by mouth 3 (three) times daily.   04/17/2021 at 0545   levothyroxine (SYNTHROID) 88 MCG tablet Take 88 mcg by mouth daily before breakfast.   04/16/2021   lisinopril (ZESTRIL) 20 MG tablet Take 1 tablet (20 mg total) by mouth daily. Decreased from twice a day.  0 04/17/2021 at 0545   lovastatin (MEVACOR) 20 MG tablet Take 20 mg by mouth daily with supper.   0 04/16/2021   metoprolol tartrate (LOPRESSOR) 25 MG tablet Take 25 mg by mouth 2 (two) times daily.    04/17/2021 at 0545   Olopatadine HCl (PAZEO) 0.7 % SOLN Apply to eye daily as needed.   04/16/2021   omeprazole (PRILOSEC) 20 MG capsule Take 20 mg by mouth daily.  1 04/16/2021   ondansetron (ZOFRAN ODT) 4 MG disintegrating tablet Take 1 tablet (4 mg total) by mouth every 6 (six) hours as needed for nausea or vomiting. 20 tablet 0 04/16/2021   Simethicone 180 MG CAPS Take 1 capsule by mouth daily as needed (gas or flatulence).   04/16/2021   alendronate (FOSAMAX) 70 MG tablet Take 70 mg by mouth every Monday.  0    cloNIDine (CATAPRES) 0.1 MG tablet Take 1 tablet (0.1 mg total) by mouth 2 (two) times daily. Decreased dose from 0.3 mg 2 times daily. 60 tablet 0    dicyclomine (BENTYL) 20 MG tablet Take 1 tablet (20 mg total) by mouth every 8 (eight) hours as needed for spasms (Abdominal cramping). 15 tablet 0     Allergies  Allergen Reactions   Hydrocodone     Other reaction(s): Unknown; pt does not remember reaction   Tramadol Nausea Only   Past Medical History:  Diagnosis Date   Arthritis    Breast cancer ([place]) 2002   Right breast ca with chemo and rad tx. Left breast ca 1995 with rad tx.    Carpal tunnel syndrome    Cataracts, bilateral    Chronic kidney disease    stage 3   Diabetes mellitus without complication (HCC)    Fibromyalgia    GERD (gastroesophageal reflux disease)    Headache    Heart murmur    Hypercholesterolemia    Hypertension    Hypothyroidism    IBS (irritable bowel syndrome)    PMR (polymyalgia rheumatica) (HCC)    Past Surgical History:  Procedure Laterality Date   BREAST EXCISIONAL BIOPSY Left 1995   postive for breast ca. lumpectomy and rad tx   MASTECTOMY Right 2002   Breast ca   RENAL ANGIOGRAPHY N/A 05/13/2019   Procedure: RENAL ANGIOGRAPHY;  Surgeon: Katha Cabal, MD;  Location: [facility];  Service: Cardiovascular;  Laterality: N/A;   TONSILLECTOMY     UPPER GI ENDOSCOPY     Social History   Socioeconomic History  Marital status: Widowed    Spouse name: Not on file   Number of children: Not on file   Years of education: Not on file   Highest education level: Not on file  Occupational History   Not on file  Tobacco Use   Smoking status: Former   Smokeless tobacco: Never  Substance and Sexual Activity   Alcohol use: No    Alcohol/week: 0.0 standard drinks   Drug use: Never   Sexual activity: Not on file  Other Topics Concern   Not on file  Social History Narrative   Not on file   Social Determinants of Health   Financial Resource Strain: Not on file  Food Insecurity: Not on file  Transportation Needs: Not on file  Physical Activity: Not on file  Stress: Not on file  Social Connections: Not on file  Intimate Partner Violence: Not on file   Social History   Social History Narrative   Not on file      ROS: Negative.   CT abd/ pelvis, 03/13/2021: IMPRESSION: 1. Ileitis without specific feature. 2. Small volume ascites presumably reactive to #1. 3.  Aortic Atherosclerosis (ICD10-I70.0).   03/21/2021 labs:  Sedimentation Rate-Automated 0 - 30 mm/hr 17    WBC (White Blood Cell Count) 4.1 - 10.2 10^3/uL 4.5   RBC (Red Blood Cell Count) 4.04 - 5.48 10^6/uL 4.03 Low    Hemoglobin 12.0 - 15.0 gm/dL 10.9 Low    Hematocrit 35.0 - 47.0 % 34.4 Low    MCV (Mean Corpuscular Volume) 80.0 - 100.0 fl 85.4   MCH (Mean Corpuscular Hemoglobin) 27.0 - 31.2 pg 27.0   MCHC (Mean Corpuscular Hemoglobin Concentration) 32.0 - 36.0 gm/dL 31.7 Low    Platelet Count 150 - 450 10^3/uL 313   RDW-CV (Red Cell Distribution Width) 11.6 - 14.8 % 13.9   MPV (Mean Platelet Volume) 9.4 - 12.4 fl 9.5   Neutrophils 1.50 - 7.80 10^3/uL 1.97   Lymphocytes 1.00 - 3.60 10^3/uL 1.79   Monocytes 0.00 - 1.50 10^3/uL 0.41   Eosinophils 0.00 - 0.55 10^3/uL 0.27    PE: HEENT: Negative. Lungs: Clear. Cardio: RR.   Assessment/Plan:  Proceed with planned endoscopy.    [name] [facility] 04/17/2021

## 2021-04-17 NOTE — Anesthesia Procedure Notes (Signed)
Date/Time: 04/17/2021 9:59 AM Performed by: Demetrius Charity, CRNA Oxygen Delivery Method: Nasal cannula Induction Type: IV induction Placement Confirmation: CO2 detector and positive ETCO2

## 2021-04-17 NOTE — Anesthesia Preprocedure Evaluation (Signed)
Anesthesia Evaluation  Patient identified by MRN, date of birth, ID band Patient awake    Reviewed: Allergy & Precautions, NPO status , Patient's Chart, lab work & pertinent test results  History of Anesthesia Complications Negative for: history of anesthetic complications  Airway Mallampati: II  TM Distance: >3 FB Neck ROM: Full    Dental  (+) Partial Upper   Pulmonary neg sleep apnea, neg COPD, former smoker,    breath sounds clear to auscultation- rhonchi (-) wheezing      Cardiovascular hypertension, Pt. on medications (-) CAD, (-) Past MI, (-) Cardiac Stents and (-) CABG  Rhythm:Regular Rate:Normal - Systolic murmurs and - Diastolic murmurs    Neuro/Psych  Headaches, neg Seizures negative psych ROS   GI/Hepatic Neg liver ROS, GERD  ,  Endo/Other  diabetes, Oral Hypoglycemic AgentsHypothyroidism   Renal/GU CRFRenal disease     Musculoskeletal  (+) Arthritis , Fibromyalgia -  Abdominal (+) - obese,   Peds  Hematology negative hematology ROS (+)   Anesthesia Other Findings Past Medical History: No date: Arthritis 2002: Breast cancer ([place])     Comment:  Right breast ca with chemo and rad tx. Left breast ca               1995 with rad tx.  No date: Carpal tunnel syndrome No date: Cataracts, bilateral No date: Chronic kidney disease     Comment:  stage 3 No date: Diabetes mellitus without complication (HCC) No date: Fibromyalgia No date: GERD (gastroesophageal reflux disease) No date: Headache No date: Heart murmur No date: Hypercholesterolemia No date: Hypertension No date: Hypothyroidism No date: IBS (irritable bowel syndrome) No date: PMR (polymyalgia rheumatica) (HCC)   Reproductive/Obstetrics                             Anesthesia Physical Anesthesia Plan  ASA: 3  Anesthesia Plan: General   Post-op Pain Management:    Induction: Intravenous  PONV Risk Score and  Plan: 2 and Propofol infusion  Airway Management Planned: Natural Airway  Additional Equipment:   Intra-op Plan:   Post-operative Plan:   Informed Consent: I have reviewed the patients History and Physical, chart, labs and discussed the procedure including the risks, benefits and alternatives for the proposed anesthesia with the patient or authorized representative who has indicated his/her understanding and acceptance.     Dental advisory given  Plan Discussed with: CRNA and Anesthesiologist  Anesthesia Plan Comments:         Anesthesia Quick Evaluation

## 2021-04-17 NOTE — Anesthesia Postprocedure Evaluation (Signed)
Anesthesia Post Note  Patient: Janice Galloway  Procedure(s) Performed: COLONOSCOPY WITH PROPOFOL  Patient location during evaluation: Endoscopy Anesthesia Type: General Level of consciousness: awake and alert and oriented Pain management: pain level controlled Vital Signs Assessment: post-procedure vital signs reviewed and stable Respiratory status: spontaneous breathing, nonlabored ventilation and respiratory function stable Cardiovascular status: blood pressure returned to baseline and stable Postop Assessment: no signs of nausea or vomiting Anesthetic complications: no   No notable events documented.   Last Vitals:  Vitals:   04/17/21 1040 04/17/21 1050  BP: 133/82 (!) 146/80  Pulse: 73 76  Resp: 15 16  Temp:    SpO2: 99% 99%    Last Pain:  Vitals:   04/17/21 1030  TempSrc: Temporal  PainSc:                  [name]

## 2021-04-17 NOTE — Op Note (Signed)
[facility]astroenterology Patient Name: Janice Galloway Procedure Date: 04/17/2021 9:49 AM MRN: [id number] Account #: [id number] Date of Birth: 1941/08/01 Admit Type: Outpatient Age: 80 Room: [facility] ENDO ROOM 1 Gender: Female Note Status: Finalized Procedure:             Colonoscopy Indications:           Abnormal CT of the GI tract, ileitis of terminal ileum. Providers:             Robert Bellow, MD Referring MD:          Baxter Hire, MD (Referring MD) Medicines:             Propofol per Anesthesia Complications:         No immediate complications. Procedure:             Pre-Anesthesia Assessment:                        - Prior to the procedure, a History and Physical was                         performed, and patient medications, allergies and                         sensitivities were reviewed. The patient's tolerance                         of previous anesthesia was reviewed.                        - The risks and benefits of the procedure and the                         sedation options and risks were discussed with the                         patient. All questions were answered and informed                         consent was obtained.                        After obtaining informed consent, the colonoscope was                         passed under direct vision. Throughout the procedure,                         the patient's blood pressure, pulse, and oxygen                         saturations were monitored continuously. The                         Colonoscope was introduced through the anus and                         advanced to the the cecum, identified by appendiceal  orifice and ileocecal valve. The colonoscopy was                         somewhat difficult due to a tortuous colon. Successful                         completion of the procedure was aided by using manual                         pressure. The patient  tolerated the procedure well.                         The quality of the bowel preparation was excellent. Findings:      Two sessile polyps were found in the descending colon, proximal       descending colon and splenic flexure. The polyps were 5 mm in size.       These were biopsied with a cold forceps for histology.      The retroflexed view of the distal rectum and anal verge was normal and       showed no anal or rectal abnormalities. Unable to cannulate the TI. Impression:            - Two 5 mm polyps in the descending colon, in the                         proximal descending colon and at the splenic flexure.                         Biopsied.                        - The distal rectum and anal verge are normal on                         retroflexion view. Recommendation:        - Telephone endoscopist for pathology results in 1                         week. Procedure Code(s):     --- Professional ---                        [phone], Colonoscopy, flexible; with biopsy, single or                         multiple Diagnosis Code(s):     --- Professional ---                        K63.5, Polyp of colon                        R93.3, Abnormal findings on diagnostic imaging of                         other parts of digestive tract CPT copyright 2019 American Medical Association. All rights reserved. The codes documented in this report are preliminary and upon coder review may  be revised to meet current compliance requirements. Robert Bellow, MD 04/17/2021 10:36:22 AM This report has been  signed electronically. Number of Addenda: 0 Note Initiated On: 04/17/2021 9:49 AM Scope Withdrawal Time: 0 hours 15 minutes 36 seconds  Total Procedure Duration: 0 hours 28 minutes 15 seconds  Estimated Blood Loss:  Estimated blood loss: none.      [facility]

## 2021-04-18 ENCOUNTER — Ambulatory Visit
Admission: RE | Admit: 2021-04-18 | Discharge: 2021-04-18 | Disposition: A | Discharge status: Home / Self Care | Payer: Medicare HMO | Source: Ambulatory Visit | Attending: Internal Medicine | Admitting: Internal Medicine

## 2021-04-18 ENCOUNTER — Other Ambulatory Visit: Payer: Self-pay

## 2021-04-18 DIAGNOSIS — R921 Mammographic calcification found on diagnostic imaging of breast: Secondary | ICD-10-CM | POA: Insufficient documentation

## 2021-04-18 DIAGNOSIS — C50312 Malignant neoplasm of lower-inner quadrant of left female breast: Secondary | ICD-10-CM | POA: Diagnosis not present

## 2021-04-18 DIAGNOSIS — D0512 Intraductal carcinoma in situ of left breast: Secondary | ICD-10-CM | POA: Diagnosis not present

## 2021-04-18 DIAGNOSIS — R928 Other abnormal and inconclusive findings on diagnostic imaging of breast: Secondary | ICD-10-CM | POA: Diagnosis not present

## 2021-04-18 HISTORY — PX: BREAST BIOPSY: SHX20

## 2021-04-18 LAB — SURGICAL PATHOLOGY

## 2021-04-22 NOTE — Progress Notes (Signed)
Navigation initiated.  Scheduled with Dr. Bary Castilla on 04/24/21. Requested Med/Onc deferred until she sees Dr. Bary Castilla.

## 2021-04-24 DIAGNOSIS — C50112 Malignant neoplasm of central portion of left female breast: Secondary | ICD-10-CM | POA: Diagnosis not present

## 2021-04-25 LAB — SURGICAL PATHOLOGY

## 2021-05-01 ENCOUNTER — Other Ambulatory Visit: Payer: Self-pay

## 2021-05-01 ENCOUNTER — Other Ambulatory Visit: Payer: Self-pay | Admitting: General Surgery

## 2021-05-01 DIAGNOSIS — C50112 Malignant neoplasm of central portion of left female breast: Secondary | ICD-10-CM

## 2021-05-01 NOTE — Progress Notes (Signed)
Subjective:     Patient ID: Janice Galloway is a 80 y.o. female.   HPI   The following portions of the patient's history were reviewed and updated as appropriate.   This an established patient is here today for: office visit. The patient has been referred today by Al Pimple, RN for evaluation of a recently diagnosed left breast caner. The patient had two left beast stereotactic biopsies completed on 04-18-21. She does have a history of a left breast cancer in 1995 followed by radiation. In addition to this, a right mastectomy in 2002 followed by radiation.    The patient reports her bra size is a 34 B.   I had first met the patient earlier this month on 04-17-21 when she had a colonoscopy to follow-up with a CT abnormality.  The colon was entirely normal, unfortunately it was not possible to cannulate the ileocecal valve.        Chief Complaint  Patient presents with   Treatment Plan Discussion      BP (!) 168/84   Pulse 76   Temp 36.6 C (97.9 F)   Ht 165.1 cm (_0 )   Wt 55.3 kg (122 lb)   SpO2 96%   BMI 20.30 kg/m        Past Medical History:  Diagnosis Date   Allergic state      Seasonal   Cardiac murmur     Carpal tunnel syndrome     Cataract cortical, senile      Bilateral eyes   Chronic kidney disease     Dermatophytosis of nail     Essential hypertension, benign     Fibromyalgia     GERD (gastroesophageal reflux disease)     Headache(784.0)     History of chemotherapy      2002   History of chicken pox     History of radiation therapy      1995 and 2002   Hypercholesterolemia     Irritable bowel syndrome     Other and unspecified hyperlipidemia     Other hammer toe (acquired)     Personal history of breast cancer      1995 left breast; 2002 right breast   PMR (polymyalgia rheumatica) (CMS-HCC)     Type II or unspecified type diabetes mellitus without mention of complication, not stated as uncontrolled (CMS-HCC)     Unspecified hypothyroidism              Past Surgical History:  Procedure Laterality Date    Bilateral Lumpectomy & Radiation for CA of Breast   09/09/1991   ANGIOGRAPHY RENAL   05/13/2019   BREAST EXCISIONAL BIOPSY Left 04/18/2021    stereo x 2   BREAST EXCISIONAL BIOPSY Left 1995   COLONOSCOPY   12/05/2010    MUS- HYPERPLASTIC POLYP DUE 02/2021   COLONOSCOPY   04/17/2021    Dr. Bary Castilla   No tubular adenomas.  No indication for follow-up colonoscopy/JWB   EGD       MASTECTOMY Right 09/08/2000   TONSILLECTOMY       UPPER GASTROINTESTINAL ENDOSCOPY   03/07/2011    MUS- NO REPEAT                OB History     Gravida  2   Para  2   Term      Preterm      AB      Living  SAB      IAB      Ectopic      Molar      Multiple      Live Births           Obstetric Comments  Age at first period 33 Age of first pregnancy 11             Social History           Socioeconomic History   Marital status: Widowed  Occupational History   Occupation: [employer]: part-time work  Tobacco Use   Smoking status: Former Smoker      Packs/day: 0.50      Years: 20.00      Pack years: 10.00      Types: Cigarettes      Quit date: 09/09/1991      Years since quitting: 29.6   Smokeless tobacco: Never Used  Vaping Use   Vaping Use: Never used  Substance and Sexual Activity   Alcohol use: Yes      Comment: wine occasionally   Drug use: Never   Sexual activity: Defer            Allergies  Allergen Reactions   Hydrocodone Nausea   Ultram [Tramadol] Nausea      Current Medications        Current Outpatient Medications  Medication Sig Dispense Refill   alendronate (FOSAMAX) 70 MG tablet Take 1 tablet (70 mg total) by mouth every 7 (seven) days Take with a full glass of water. Do not lie down for the next 30 min. 12 tablet 3   Bifidobacterium infantis (ALIGN ORAL) Take by mouth as needed       calcium carbonate (TUMS ORAL) Take by mouth as needed       cetirizine (ZYRTEC) 10  mg capsule Take 10 mg by mouth once daily as needed         cloNIDine HCL (CATAPRES) 0.1 MG tablet Take 2 tablets (0.2 mg total) by mouth 2 (two) times daily for 30 days 120 tablet 0   dicyclomine (BENTYL) 20 mg tablet Take 20 mg by mouth once daily as needed       doxazosin (CARDURA) 2 MG tablet TAKE 1 TABLET(2 MG) BY MOUTH EVERY NIGHT 30 tablet 11   fluticasone propionate (FLONASE) 50 mcg/actuation nasal spray Place 1 spray into both nostrils 2 (two) times daily 16 g 0   gabapentin (NEURONTIN) 100 MG capsule Take 1 capsule (100 mg total) by mouth 3 (three) times daily 90 capsule 11   glipiZIDE (GLUCOTROL) 5 MG tablet Take 0.5 tablets (2.5 mg total) by mouth every morning before breakfast 15 tablet 11   levothyroxine (SYNTHROID) 88 MCG tablet Take 1 tablet (88 mcg total) by mouth once daily Take on an empty stomach with a glass of water at least 30-60 minutes before breakfast. 90 tablet 3   lisinopriL (ZESTRIL) 20 MG tablet TAKE 1 TABLET BY MOUTH TWICE DAILY (Patient taking differently: 20 mg 2 (two) times daily) 180 tablet 1   lovastatin (MEVACOR) 20 MG tablet TAKE 1 TABLET(20 MG) BY MOUTH DAILY WITH DINNER 90 tablet 1   magnesium oxide (MAG-OX) 400 mg tablet Take 400 mg by mouth once daily Patient takes this occasionally.        metoprolol tartrate (LOPRESSOR) 25 MG tablet TAKE 1 TABLET(25 MG) BY MOUTH TWICE DAILY 180 tablet 1   omeprazole (PRILOSEC) 20 MG DR capsule TAKE 1 CAPSULE(20 MG) BY MOUTH EVERY  DAY 90 capsule 3   ondansetron (ZOFRAN-ODT) 4 MG disintegrating tablet Take 4 mg by mouth every 8 (eight) hours as needed for Nausea       PAZEO 0.7 % ophthalmic solution Place 1 drop into both eyes once daily as needed      0   hydrALAZINE (APRESOLINE) 50 MG tablet Take 1 tablet (50 mg total) by mouth 3 (three) times daily 90 tablet 11    No current facility-administered medications for this visit.             Family History  Problem Relation Age of Onset   Atrial fibrillation (Abnormal  heart rhythm sometimes requiring treatment with blood thinners) Mother     High blood pressure (Hypertension) Mother     Diabetes type II Mother     Lung cancer Father     Prostate cancer Father     Breast cancer Neg Hx                  Review of Systems  Constitutional: Negative for chills and fever.  Respiratory: Negative for cough.          Objective:   Physical Exam Exam conducted with a chaperone present.  Constitutional:      Appearance: Normal appearance.  Cardiovascular:     Rate and Rhythm: Normal rate and regular rhythm.     Pulses: Normal pulses.     Heart sounds: Normal heart sounds.  Pulmonary:     Effort: Pulmonary effort is normal.     Breath sounds: Normal breath sounds.  Chest:    Musculoskeletal:     Cervical back: Neck supple.  Lymphadenopathy:     Upper Body:     Right upper body: No supraclavicular or axillary adenopathy.     Left upper body: No supraclavicular or axillary adenopathy.  Skin:    General: Skin is warm and dry.  Neurological:     Mental Status: She is alert and oriented to person, place, and time.  Psychiatric:        Mood and Affect: Mood normal.        Behavior: Behavior normal.      Labs and Radiology:    Mammogram review:   Screening mammogram of March 22, 2020 and multiple studies of March 26, 2021 through Apr 20, 2021 were independently reviewed.  New/increased microcalcifications in the medial aspect of the left breast.   Pathology: A. 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  - DCIS is present in all seven blocks and spans up to 12 mm in one  section.   B. BREAST, LEFT MEDIAL ANTERIOR; STEREOTACTIC-GUIDED CORE BIOPSY:  - INVASIVE MAMMARY CARCINOMA WITH LOBULAR FEATURES  Size of invasive carcinoma: up to 2 mm, present in at least 6 foci in 4  different core fragments   Histologic grade of invasive carcinoma: Grade 1                       Glandular/tubular differentiation score: 3                       Nuclear pleomorphism score: 1                       Mitotic rate score: 1  Total score: 5  Ductal carcinoma in situ: Present, with calcification    Receptor status is pending.   Ultrasound:    Ultrasound examination of the left breast was undertaken to better evaluate the recently completed biopsy sites.  In the 9 o'clock position of the left breast 4 cm from the nipple a well-defined biopsy site measuring 0.7 x 1.3 x 1.8 cm is identified.  A clip is not clearly evident within this cavity.  In the retroareolar area a 0.97 x 1.12 x 2.42 biopsy site with an obvious clip is identified.  These 2 areas are about 3 cm apart from each other.  BI-RADS-6.      Assessment:     New left breast cancer, post prior lumpectomy and whole breast radiation in 1995.   Contralateral mastectomy without evidence of recurrent disease.    Plan:     The patient is in very good health and we need to be looking at a 10-15-year life expectancy.   Textbook management would be for mastectomy, especially in the face of prior radiation and to adjacent foci.   With her contralateral mastectomy, this would be a symmetry procedure and she is done exceptionally well with her right mastectomy.   A second option would be to consider wide excision of the 2 areas which would likely include the nipple areolar complex.  This would leave her breast mound to discourage the bra off from riding up over the mastectomy site and would likely provide continued contour on the left side making a formal prosthesis unnecessary.  Whether or not she would be a candidate for additional radiation now 27 years after her original therapy would be a consideration.  Without additional RT and antiestrogen therapy her chance of recurrence in breast would be about 30% over 10 years.  If additional  RT could be administered this would fall of less than 10%, and even if additional RT was not possible antiestrogen therapy, if appropriate 1 receptor assay is present, would likely keep her in the 10-15% range.  The finding of a "lobular" features might suggest a benefit to mastectomy if indeed a multi focal tumor is present outside of the known area.   I spoke with the patient's son, Brandyn Thien by phone during her visit, and later in the afternoon with her daughter, Towana Badger.  Her son lives in [place], her daughter works for [employer] as the occupational safety person for [employer].    An informational brochure was provided.  The patient has done exceptionally well with her original left cancer 1995 and her right cancer in 2002 from which she is recovered beautifully.   We discussed the "off label" use of breast conservation in the face of prior surgery and she is aware this is a variance from standard care.   The patient will discuss with her children her options and notify the office of how she would like to proceed.   Over 1 hour was spent with the patient, the majority of which was record review and consultation and information provided to both of her children.    This note is partially prepared by Ledell Noss, CMA acting as a scribe in the presence of Dr. Hervey Ard, MD.    The documentation recorded by the scribe accurately reflects the service I personally performed and the decisions made by me.    Robert Bellow, MD FACS

## 2021-05-02 ENCOUNTER — Other Ambulatory Visit: Payer: Self-pay

## 2021-05-02 ENCOUNTER — Other Ambulatory Visit
Admission: RE | Admit: 2021-05-02 | Discharge: 2021-05-02 | Disposition: A | Discharge status: Home / Self Care | Payer: Medicare HMO | Source: Ambulatory Visit | Attending: General Surgery | Admitting: General Surgery

## 2021-05-02 NOTE — Patient Instructions (Addendum)
Your procedure is scheduled on: 05/10/21  Report to the Registration Desk on the 1st floor of the [facility]. To find out your arrival time, please call [phone] between 1PM - 3PM on: 05/09/21   REMEMBER: Instructions that are not followed completely may result in serious medical risk, up to and including death; or upon the discretion of your surgeon and anesthesiologist your surgery may need to be rescheduled.  Do not eat food after midnight the night before surgery.  No gum chewing, lozengers or hard candies.  You may however, drink CLEAR liquids up to 2 hours before you are scheduled to arrive for your surgery. Do not drink anything within 2 hours of your scheduled arrival time.  Clear liquids include: - water  - apple juice without pulp - gatorade (not RED, PURPLE, OR BLUE) - black coffee or tea (Do NOT add milk or creamers to the coffee or tea) Do NOT drink anything that is not on this list.  Type 1 and Type 2 diabetics should only drink water.  TAKE THESE MEDICATIONS THE MORNING OF SURGERY WITH A SIP OF WATER:  - cloNIDine (CATAPRES) 0.1 MG tablet - gabapentin (NEURONTIN) 100 MG capsule - hydrALAZINE (APRESOLINE) 50 MG tablet - levothyroxine (SYNTHROID) 88 MCG tablet - metoprolol tartrate (LOPRESSOR) 25 MG tablet - omeprazole (PRILOSEC) 20 MG capsule, take one the night before and one on the morning of surgery - helps to prevent nausea after surgery.   Follow recommendations from Cardiologist, Pulmonologist or PCP regarding stopping Aspirin, Coumadin, Plavix, Eliquis, Pradaxa, or Pletal. Stop ASA 81 mg beginning 05/03/21.  One week prior to surgery: Stop Anti-inflammatories (NSAIDS) such as Advil, Aleve, Ibuprofen, Motrin, Naproxen, Naprosyn and Aspirin based products such as Excedrin, Goodys Powder, BC Powder.  Stop ANY OVER THE COUNTER supplements until after surgery.  You may however, continue to take Tylenol if needed for pain up until the day of surgery.  No  Alcohol for 24 hours before or after surgery.  No Smoking including e-cigarettes for 24 hours prior to surgery.  No chewable tobacco products for at least 6 hours prior to surgery.  No nicotine patches on the day of surgery.  Do not use any "recreational" drugs for at least a week prior to your surgery.  Please be advised that the combination of cocaine and anesthesia may have negative outcomes, up to and including death. If you test positive for cocaine, your surgery will be cancelled.  On the morning of surgery brush your teeth with toothpaste and water, you may rinse your mouth with mouthwash if you wish. Do not swallow any toothpaste or mouthwash.  Do not wear jewelry, make-up, hairpins, clips or nail polish.  Do not wear lotions, powders, or perfumes.   Do not shave body from the neck down 48 hours prior to surgery just in case you cut yourself which could leave a site for infection.  Also, freshly shaved skin may become irritated if using the CHG soap.  Contact lenses, hearing aids and dentures may not be worn into surgery.  Do not bring valuables to the hospital. [facility] is not responsible for any missing/lost belongings or valuables.   Notify your doctor if there is any change in your medical condition (cold, fever, infection).  Wear comfortable clothing (specific to your surgery type) to the hospital.  After surgery, you can help prevent lung complications by doing breathing exercises.  Take deep breaths and cough every 1-2 hours. Your doctor may order a device called  an Chiropodist to help you take deep breaths. When coughing or sneezing, hold a pillow firmly against your incision with both hands. This is called "splinting." Doing this helps protect your incision. It also decreases belly discomfort.  If you are being admitted to the hospital overnight, leave your suitcase in the car. After surgery it may be brought to your room.  If you are being discharged  the day of surgery, you will not be allowed to drive home. You will need a responsible adult (18 years or older) to drive you home and stay with you that night.   If you are taking public transportation, you will need to have a responsible adult (18 years or older) with you. Please confirm with your physician that it is acceptable to use public transportation.   Please call the [facility] at [phone] if you have any questions about these instructions.  Surgery Visitation Policy:  Patients undergoing a surgery or procedure may have one family member or support person with them as long as that person is not COVID-19 positive or experiencing its symptoms.  That person may remain in the waiting area during the procedure.  Inpatient Visitation:    Visiting hours are 7 a.m. to 8 p.m. Inpatients will be allowed two visitors daily. The visitors may change each day during the patient's stay. No visitors under the age of 51. Any visitor under the age of 13 must be accompanied by an adult. The visitor must pass COVID-19 screenings, use hand sanitizer when entering and exiting the patient's room and wear a mask at all times, including in the patient's room. Patients must also wear a mask when staff or their visitor are in the room. Masking is required regardless of vaccination status.

## 2021-05-09 MED ORDER — CEFAZOLIN SODIUM-DEXTROSE 2-4 GM/100ML-% IV SOLN
2.0000 g | INTRAVENOUS | Status: AC
  Administered 2021-05-10: 2 g via INTRAVENOUS

## 2021-05-09 MED ORDER — CHLORHEXIDINE GLUCONATE CLOTH 2 % EX PADS
6.0000 | MEDICATED_PAD | Freq: Once | CUTANEOUS | Status: AC
  Administered 2021-05-10: 6 via TOPICAL

## 2021-05-09 MED ORDER — CHLORHEXIDINE GLUCONATE 0.12 % MT SOLN
15.0000 mL | Freq: Once | OROMUCOSAL | Status: AC
  Administered 2021-05-10: 15 mL via OROMUCOSAL

## 2021-05-09 MED ORDER — ORAL CARE MOUTH RINSE: 15.0000 mL | Freq: Once | OROMUCOSAL | Status: AC

## 2021-05-09 MED ORDER — SODIUM CHLORIDE 0.9 % IV SOLN: INTRAVENOUS | Status: DC

## 2021-05-10 ENCOUNTER — Encounter
Admission: RE | Disposition: A | Discharge status: Home Health | Payer: Self-pay | Source: Home / Self Care | Attending: General Surgery

## 2021-05-10 ENCOUNTER — Ambulatory Visit
Admission: RE | Admit: 2021-05-10 | Discharge: 2021-05-10 | Disposition: A | Discharge status: Home Health | Payer: Medicare HMO | Attending: General Surgery | Admitting: General Surgery

## 2021-05-10 ENCOUNTER — Ambulatory Visit: Payer: Medicare HMO | Admitting: Certified Registered"

## 2021-05-10 ENCOUNTER — Encounter: Payer: Self-pay | Admitting: General Surgery

## 2021-05-10 ENCOUNTER — Other Ambulatory Visit: Payer: Self-pay

## 2021-05-10 ENCOUNTER — Ambulatory Visit
Admission: RE | Admit: 2021-05-10 | Discharge: 2021-05-10 | Disposition: A | Discharge status: Home / Self Care | Payer: Medicare HMO | Source: Ambulatory Visit | Attending: General Surgery | Admitting: General Surgery

## 2021-05-10 DIAGNOSIS — Z888 Allergy status to other drugs, medicaments and biological substances status: Secondary | ICD-10-CM | POA: Diagnosis not present

## 2021-05-10 DIAGNOSIS — I129 Hypertensive chronic kidney disease with stage 1 through stage 4 chronic kidney disease, or unspecified chronic kidney disease: Secondary | ICD-10-CM | POA: Diagnosis not present

## 2021-05-10 DIAGNOSIS — C50112 Malignant neoplasm of central portion of left female breast: Secondary | ICD-10-CM | POA: Diagnosis not present

## 2021-05-10 DIAGNOSIS — Z79890 Hormone replacement therapy (postmenopausal): Secondary | ICD-10-CM | POA: Diagnosis not present

## 2021-05-10 DIAGNOSIS — Z7983 Long term (current) use of bisphosphonates: Secondary | ICD-10-CM | POA: Diagnosis not present

## 2021-05-10 DIAGNOSIS — N183 Chronic kidney disease, stage 3 unspecified: Secondary | ICD-10-CM | POA: Diagnosis not present

## 2021-05-10 DIAGNOSIS — E039 Hypothyroidism, unspecified: Secondary | ICD-10-CM | POA: Diagnosis not present

## 2021-05-10 DIAGNOSIS — Z9011 Acquired absence of right breast and nipple: Secondary | ICD-10-CM | POA: Diagnosis not present

## 2021-05-10 DIAGNOSIS — Z79899 Other long term (current) drug therapy: Secondary | ICD-10-CM | POA: Insufficient documentation

## 2021-05-10 DIAGNOSIS — E1136 Type 2 diabetes mellitus with diabetic cataract: Secondary | ICD-10-CM | POA: Insufficient documentation

## 2021-05-10 DIAGNOSIS — E7800 Pure hypercholesterolemia, unspecified: Secondary | ICD-10-CM | POA: Diagnosis not present

## 2021-05-10 DIAGNOSIS — Z885 Allergy status to narcotic agent status: Secondary | ICD-10-CM | POA: Diagnosis not present

## 2021-05-10 DIAGNOSIS — E1122 Type 2 diabetes mellitus with diabetic chronic kidney disease: Secondary | ICD-10-CM | POA: Diagnosis not present

## 2021-05-10 DIAGNOSIS — Z8616 Personal history of COVID-19: Secondary | ICD-10-CM | POA: Insufficient documentation

## 2021-05-10 DIAGNOSIS — Z87891 Personal history of nicotine dependence: Secondary | ICD-10-CM | POA: Diagnosis not present

## 2021-05-10 DIAGNOSIS — Z7982 Long term (current) use of aspirin: Secondary | ICD-10-CM | POA: Insufficient documentation

## 2021-05-10 DIAGNOSIS — Z7984 Long term (current) use of oral hypoglycemic drugs: Secondary | ICD-10-CM | POA: Insufficient documentation

## 2021-05-10 DIAGNOSIS — D0512 Intraductal carcinoma in situ of left breast: Secondary | ICD-10-CM | POA: Diagnosis not present

## 2021-05-10 DIAGNOSIS — Z853 Personal history of malignant neoplasm of breast: Secondary | ICD-10-CM | POA: Diagnosis not present

## 2021-05-10 DIAGNOSIS — C50912 Malignant neoplasm of unspecified site of left female breast: Secondary | ICD-10-CM | POA: Diagnosis not present

## 2021-05-10 HISTORY — PX: SIMPLE MASTECTOMY WITH AXILLARY SENTINEL NODE BIOPSY: SHX6098

## 2021-05-10 LAB — POCT I-STAT, CHEM 8
BUN: 13 mg/dL (ref 8–23)
Calcium, Ion: 1.17 mmol/L (ref 1.15–1.40)
Chloride: 108 mmol/L (ref 98–111)
Creatinine, Ser: 1.4 mg/dL — ABNORMAL HIGH (ref 0.44–1.00)
Glucose, Bld: 148 mg/dL — ABNORMAL HIGH (ref 70–99)
HCT: 35 % — ABNORMAL LOW (ref 36.0–46.0)
Hemoglobin: 11.9 g/dL — ABNORMAL LOW (ref 12.0–15.0)
Potassium: 4.7 mmol/L (ref 3.5–5.1)
Sodium: 142 mmol/L (ref 135–145)
TCO2: 26 mmol/L (ref 22–32)

## 2021-05-10 LAB — GLUCOSE, CAPILLARY: Glucose-Capillary: 141 mg/dL — ABNORMAL HIGH (ref 70–99)

## 2021-05-10 SURGERY — SIMPLE MASTECTOMY WITH AXILLARY SENTINEL NODE BIOPSY
Anesthesia: General | Site: Breast | Laterality: Left

## 2021-05-10 MED ORDER — METHYLENE BLUE 0.5 % INJ SOLN
INTRAVENOUS | Status: DC | PRN
  Administered 2021-05-10: 4 mL via SUBMUCOSAL

## 2021-05-10 MED ORDER — CEFAZOLIN SODIUM-DEXTROSE 2-4 GM/100ML-% IV SOLN
INTRAVENOUS | Status: AC
  Filled 2021-05-10: qty 100

## 2021-05-10 MED ORDER — ACETAMINOPHEN 10 MG/ML IV SOLN
INTRAVENOUS | Status: AC
  Filled 2021-05-10: qty 100

## 2021-05-10 MED ORDER — TRAMADOL HCL 50 MG PO TABS: 50.0000 mg | ORAL_TABLET | Freq: Four times a day (QID) | ORAL | 0 refills | Status: AC | PRN

## 2021-05-10 MED ORDER — METHYLENE BLUE 0.5 % INJ SOLN
INTRAVENOUS | Status: AC
  Filled 2021-05-10: qty 10

## 2021-05-10 MED ORDER — FENTANYL CITRATE (PF) 100 MCG/2ML IJ SOLN
INTRAMUSCULAR | Status: AC
  Filled 2021-05-10: qty 2

## 2021-05-10 MED ORDER — PROPOFOL 10 MG/ML IV BOLUS
INTRAVENOUS | Status: AC
  Filled 2021-05-10: qty 20

## 2021-05-10 MED ORDER — PHENYLEPHRINE HCL (PRESSORS) 10 MG/ML IV SOLN
INTRAVENOUS | Status: DC | PRN
  Administered 2021-05-10 (×2): 50 ug via INTRAVENOUS

## 2021-05-10 MED ORDER — TECHNETIUM TC 99M TILMANOCEPT KIT
1.0000 | PACK | Freq: Once | INTRAVENOUS | Status: AC | PRN
  Administered 2021-05-10: 1.072 via INTRADERMAL

## 2021-05-10 MED ORDER — PROPOFOL 10 MG/ML IV BOLUS
INTRAVENOUS | Status: DC | PRN
  Administered 2021-05-10: 100 mg via INTRAVENOUS

## 2021-05-10 MED ORDER — ACETAMINOPHEN 10 MG/ML IV SOLN
INTRAVENOUS | Status: DC | PRN
  Administered 2021-05-10: 1000 mg via INTRAVENOUS

## 2021-05-10 MED ORDER — DEXAMETHASONE SODIUM PHOSPHATE 10 MG/ML IJ SOLN
INTRAMUSCULAR | Status: DC | PRN
  Administered 2021-05-10: 5 mg via INTRAVENOUS

## 2021-05-10 MED ORDER — FENTANYL CITRATE (PF) 100 MCG/2ML IJ SOLN
INTRAMUSCULAR | Status: DC | PRN
  Administered 2021-05-10 (×2): 50 ug via INTRAVENOUS

## 2021-05-10 MED ORDER — CHLORHEXIDINE GLUCONATE 0.12 % MT SOLN
OROMUCOSAL | Status: AC
  Filled 2021-05-10: qty 15

## 2021-05-10 MED ORDER — ONDANSETRON HCL 4 MG/2ML IJ SOLN: 4.0000 mg | Freq: Once | INTRAMUSCULAR | Status: DC | PRN

## 2021-05-10 MED ORDER — ONDANSETRON HCL 4 MG/2ML IJ SOLN
INTRAMUSCULAR | Status: DC | PRN
  Administered 2021-05-10: 4 mg via INTRAVENOUS

## 2021-05-10 MED ORDER — EPHEDRINE SULFATE 50 MG/ML IJ SOLN
INTRAMUSCULAR | Status: DC | PRN
  Administered 2021-05-10: 10 mg via INTRAVENOUS

## 2021-05-10 MED ORDER — LIDOCAINE HCL (CARDIAC) PF 100 MG/5ML IV SOSY
PREFILLED_SYRINGE | INTRAVENOUS | Status: DC | PRN
  Administered 2021-05-10: 50 mg via INTRAVENOUS

## 2021-05-10 MED ORDER — FENTANYL CITRATE (PF) 100 MCG/2ML IJ SOLN: 25.0000 ug | INTRAMUSCULAR | Status: DC | PRN

## 2021-05-10 MED ORDER — STERILE WATER FOR IRRIGATION IR SOLN
Status: DC | PRN
  Administered 2021-05-10: 1000 mL

## 2021-05-10 SURGICAL SUPPLY — 53 items
APL PRP STRL LF DISP 70% ISPRP (MISCELLANEOUS) ×1
APPLIER CLIP 11 MED OPEN (CLIP)
APPLIER CLIP 13 LRG OPEN (CLIP)
APR CLP LRG 13 20 CLIP (CLIP)
APR CLP MED 11 20 MLT OPN (CLIP)
BINDER BREAST MEDIUM (GAUZE/BANDAGES/DRESSINGS) ×2 IMPLANT
BLADE PHOTON ILLUMINATED (MISCELLANEOUS) ×2 IMPLANT
BLADE SURG 15 STRL SS SAFETY (BLADE) ×2 IMPLANT
BULB RESERV EVAC DRAIN JP 100C (MISCELLANEOUS) ×2 IMPLANT
CHLORAPREP W/TINT 26 (MISCELLANEOUS) ×2 IMPLANT
CLIP APPLIE 11 MED OPEN (CLIP) IMPLANT
CLIP APPLIE 13 LRG OPEN (CLIP) IMPLANT
CNTNR SPEC 2.5X3XGRAD LEK (MISCELLANEOUS) ×3
CONT SPEC 4OZ STER OR WHT (MISCELLANEOUS) ×3
CONT SPEC 4OZ STRL OR WHT (MISCELLANEOUS) ×3
CONTAINER SPEC 2.5X3XGRAD LEK (MISCELLANEOUS) ×3 IMPLANT
DRAIN CHANNEL JP 15F RND 16 (MISCELLANEOUS) ×2 IMPLANT
DRAPE LAPAROTOMY TRNSV 106X77 (MISCELLANEOUS) ×2 IMPLANT
DRSG GAUZE FLUFF 36X18 (GAUZE/BANDAGES/DRESSINGS) ×2 IMPLANT
DRSG TELFA 3X8 NADH (GAUZE/BANDAGES/DRESSINGS) ×2 IMPLANT
ELECT CAUTERY BLADE TIP 2.5 (TIP) ×2
ELECT REM PT RETURN 9FT ADLT (ELECTROSURGICAL) ×2
ELECTRODE CAUTERY BLDE TIP 2.5 (TIP) ×1 IMPLANT
ELECTRODE REM PT RTRN 9FT ADLT (ELECTROSURGICAL) ×1 IMPLANT
GAUZE 4X4 16PLY ~~LOC~~+RFID DBL (SPONGE) ×2 IMPLANT
GLOVE SURG ENC MOIS LTX SZ7.5 (GLOVE) ×2 IMPLANT
GLOVE SURG UNDER LTX SZ8 (GLOVE) ×2 IMPLANT
GOWN STRL REUS W/ TWL LRG LVL3 (GOWN DISPOSABLE) ×2 IMPLANT
GOWN STRL REUS W/TWL LRG LVL3 (GOWN DISPOSABLE) ×4
LABEL OR SOLS (LABEL) ×2 IMPLANT
MANIFOLD NEPTUNE II (INSTRUMENTS) ×2 IMPLANT
PACK BASIN MINOR ARMC (MISCELLANEOUS) ×2 IMPLANT
PIN SAFETY STRL (MISCELLANEOUS) ×2 IMPLANT
RETRACTOR RING XSMALL (MISCELLANEOUS) IMPLANT
RTRCTR WOUND ALEXIS 13CM XS SH (MISCELLANEOUS)
SHEARS FOC LG CVD HARMONIC 17C (MISCELLANEOUS) IMPLANT
SLEVE PROBE SENORX GAMMA FIND (MISCELLANEOUS) ×2 IMPLANT
SPONGE T-LAP 18X18 ~~LOC~~+RFID (SPONGE) ×2 IMPLANT
STRIP CLOSURE SKIN 1/2X4 (GAUZE/BANDAGES/DRESSINGS) ×4 IMPLANT
SUT ETHILON 3-0 FS-10 30 BLK (SUTURE) ×2
SUT SILK 2 0 (SUTURE) ×2
SUT SILK 2-0 30XBRD TIE 12 (SUTURE) ×1 IMPLANT
SUT SILK 3 0 (SUTURE) ×2
SUT SILK 3-0 18XBRD TIE 12 (SUTURE) ×1 IMPLANT
SUT VIC AB 2-0 CT1 27 (SUTURE) ×6
SUT VIC AB 2-0 CT1 TAPERPNT 27 (SUTURE) ×3 IMPLANT
SUT VIC AB 3-0 SH 27 (SUTURE) ×2
SUT VIC AB 3-0 SH 27X BRD (SUTURE) ×1 IMPLANT
SUT VICRYL+ 3-0 144IN (SUTURE) IMPLANT
SUTURE EHLN 3-0 FS-10 30 BLK (SUTURE) ×1 IMPLANT
SWABSTK COMLB BENZOIN TINCTURE (MISCELLANEOUS) ×2 IMPLANT
TAPE TRANSPORE STRL 2 31045 (GAUZE/BANDAGES/DRESSINGS) ×2 IMPLANT
WATER STERILE IRR 500ML POUR (IV SOLUTION) ×2 IMPLANT

## 2021-05-10 NOTE — Transfer of Care (Signed)
Immediate Anesthesia Transfer of Care Note  Patient: NAILAH OCEJO  Procedure(s) Performed: SIMPLE MASTECTOMY (Left: Breast)  Patient Location: PACU  Anesthesia Type:General  Level of Consciousness: awake  Airway & Oxygen Therapy: Patient Spontanous Breathing and Patient connected to face mask oxygen  Post-op Assessment: Report given to RN and Post -op Vital signs reviewed and stable  Post vital signs: Reviewed and stable  Last Vitals:  Vitals Value Taken Time  BP 167/87 05/10/21 1015  Temp    Pulse 80 05/10/21 1016  Resp 12 05/10/21 1016  SpO2 100 % 05/10/21 1016  Vitals shown include unvalidated device data.  Last Pain:  Vitals:   05/10/21 0823  TempSrc: Temporal  PainSc: 0-No pain         Complications: No notable events documented.

## 2021-05-10 NOTE — Op Note (Signed)
Preoperative diagnosis:: Left breast cancer, status post contralateral mastectomy.  Postoperative diagnosis: Same.  Operative procedure: Left simple mastectomy with attempted sentinel node biopsy.  Operating Surgeon: Hervey Ard, MD.  Anesthesia: General by LMA.  Estimated blood loss: Less than 30 cc.  Clinical note: This 80 year old woman has developed a left breast cancer.  Status post right mastectomy for cancer.  After reviewing options for management she desired breast conservation.  She is felt to be a candidate for sentinel node biopsy based on her excellent functional status.  She was injected with technetium sulfur colloid the morning of the procedure.  She received Ancef on induction of anesthesia.  SCD stockings for DVT prevention.  Operative note: With the patient under adequate general anesthesia the area of the areola was cleansed with alcohol and 4 cc of 0.5% methylene blue was injected in the subareolar plexus.  The breast chest and axilla was then cleansed with ChloraPrep and draped.  An elliptical incision was outlined.  The skin was incised sharply and the remaining dissection completed with the photon blade.  Flaps were elevated to about 3 fingerbreadths below the clavicle superiorly where the breast tissue petered out.  The flaps were fairly thin at 3-4 mm.  The inferior edge of dissection was the rectus fascia.  Medial dissection of the sternum and lateral border was eviscerated from our 4.  Skin flaps were elevated circumferentially.  Intercostal perforating vessels controlled with 3-0 Vicryl ties.  The breast and the underlying pectoralis fashion were taken as 1.  Scanning through the axilla with the node seeker device showed no area of increased uptake 2.  No blue lymphatics.  The envelope was opened to allow good visualization of the bilateral 5 cm and no fatty or nodal tissue was identified.  At this point it was elected to abandon sentinel node procedure.  The wound was  irrigated with sterile water.  A 15 [place] Blake drain was brought to the inferior medial flap.  Bleeding from the edge was controlled with cautery.  The drain was anchored in place with a 3-0 nylon suture.  Skin flaps were approximated with a running 2-0 Vicryl suture at the deep dermal level in 2 segments.  Benzoin, Steri-Strips, Telfa, fluff gauze and a compressive wrap were applied.  The drain was placed to self suction.  Patient tolerated the procedure well and was taken to the PACU in stable condition.

## 2021-05-10 NOTE — Anesthesia Procedure Notes (Signed)
Procedure Name: LMA Insertion Date/Time: 05/10/2021 9:07 AM Performed by: Biagio Borg, CRNA Pre-anesthesia Checklist: Patient identified, Emergency Drugs available, Suction available and Patient being monitored Patient Re-evaluated:Patient Re-evaluated prior to induction Oxygen Delivery Method: Circle system utilized Preoxygenation: Pre-oxygenation with 100% oxygen Induction Type: IV induction LMA: LMA inserted LMA Size: 4.0 Tube type: Oral Number of attempts: 1 Placement Confirmation: ETT inserted through vocal cords under direct vision, positive ETCO2 and breath sounds checked- equal and bilateral Tube secured with: Tape Dental Injury: Teeth and Oropharynx as per pre-operative assessment

## 2021-05-10 NOTE — Anesthesia Postprocedure Evaluation (Signed)
Anesthesia Post Note  Patient: Janice Galloway  Procedure(s) Performed: SIMPLE MASTECTOMY (Left: Breast)  Patient location during evaluation: PACU Anesthesia Type: General Level of consciousness: awake and alert and oriented Pain management: pain level controlled Vital Signs Assessment: post-procedure vital signs reviewed and stable Respiratory status: spontaneous breathing, nonlabored ventilation and respiratory function stable Cardiovascular status: blood pressure returned to baseline and stable Postop Assessment: no signs of nausea or vomiting Anesthetic complications: no   No notable events documented.   Last Vitals:  Vitals:   05/10/21 1101 05/10/21 1109  BP: (!) 187/68 (!) 170/74  Pulse: 64   Resp: 18   Temp: (!) 35.9 C   SpO2: 97%     Last Pain:  Vitals:   05/10/21 1137  TempSrc:   PainSc: 0-No pain                 Arya Boxley

## 2021-05-10 NOTE — Anesthesia Preprocedure Evaluation (Addendum)
Anesthesia Evaluation  Patient identified by MRN, date of birth, ID band Patient awake    Reviewed: Allergy & Precautions, NPO status , Patient's Chart, lab work & pertinent test results  History of Anesthesia Complications Negative for: history of anesthetic complications  Airway Mallampati: II  TM Distance: >3 FB Neck ROM: Full    Dental  (+) Missing, Poor Dentition   Pulmonary neg sleep apnea, neg COPD, former smoker,    breath sounds clear to auscultation- rhonchi (-) wheezing      Cardiovascular hypertension, Pt. on medications (-) CAD, (-) Past MI, (-) Cardiac Stents and (-) CABG  Rhythm:Regular Rate:Normal - Systolic murmurs and - Diastolic murmurs    Neuro/Psych  Headaches, neg Seizures negative psych ROS   GI/Hepatic Neg liver ROS, GERD  ,  Endo/Other  diabetes, Oral Hypoglycemic AgentsHypothyroidism   Renal/GU CRFRenal disease     Musculoskeletal  (+) Arthritis , Fibromyalgia -  Abdominal (+) - obese,   Peds  Hematology negative hematology ROS (+)   Anesthesia Other Findings Past Medical History: No date: Arthritis 2002: Breast cancer ([place])     Comment:  Right breast ca with chemo and rad tx. Left breast ca               1995 with rad tx.  No date: Carpal tunnel syndrome No date: Cataracts, bilateral No date: Chronic kidney disease     Comment:  stage 3 2021: COVID-19 No date: Diabetes mellitus without complication (HCC) No date: Fibromyalgia No date: GERD (gastroesophageal reflux disease) No date: Headache No date: Heart murmur No date: Hypercholesterolemia No date: Hypertension No date: Hypothyroidism No date: IBS (irritable bowel syndrome) No date: PMR (polymyalgia rheumatica) (HCC)   Reproductive/Obstetrics                            Anesthesia Physical Anesthesia Plan  ASA: 3  Anesthesia Plan: General   Post-op Pain Management:    Induction:  Intravenous  PONV Risk Score and Plan: 2 and Ondansetron and Dexamethasone  Airway Management Planned: LMA  Additional Equipment:   Intra-op Plan:   Post-operative Plan:   Informed Consent: I have reviewed the patients History and Physical, chart, labs and discussed the procedure including the risks, benefits and alternatives for the proposed anesthesia with the patient or authorized representative who has indicated his/her understanding and acceptance.     Dental advisory given  Plan Discussed with: CRNA and Anesthesiologist  Anesthesia Plan Comments:         Anesthesia Quick Evaluation

## 2021-05-10 NOTE — H&P (Signed)
Janice Galloway YN:[id number] Feb 03, 1941     HPI: Healthy 80 year old woman with recently identified left breast cancer.  Prior right mastectomy.  Options for management reviewed.  She desired to proceed with a mastectomy.     Medications Prior to Admission  Medication Sig Dispense Refill Last Dose   alendronate (FOSAMAX) 70 MG tablet Take 70 mg by mouth every Monday.  0 05/06/2021   aspirin EC 81 MG tablet Take 81 mg by mouth daily. Swallow whole.   05/03/2021   azelastine (OPTIVAR) 0.05 % ophthalmic solution Place 1 drop into both eyes in the morning and at bedtime.   05/09/2021   cloNIDine (CATAPRES) 0.1 MG tablet Take 0.2 mg by mouth 2 (two) times daily.   05/10/2021   dicyclomine (BENTYL) 20 MG tablet Take 1 tablet (20 mg total) by mouth every 8 (eight) hours as needed for spasms (Abdominal cramping). 15 tablet 0 Past Week   doxazosin (CARDURA) 4 MG tablet Take 1 tablet (4 mg total) by mouth every evening.   05/09/2021   gabapentin (NEURONTIN) 100 MG capsule Take 100 mg by mouth 3 (three) times daily.   05/10/2021   glipiZIDE (GLUCOTROL) 5 MG tablet Take 2.5 mg by mouth daily before breakfast.   05/09/2021   hydrALAZINE (APRESOLINE) 50 MG tablet Take 50 mg by mouth 3 (three) times daily.   05/10/2021   levothyroxine (SYNTHROID) 88 MCG tablet Take 88 mcg by mouth daily before breakfast.   05/10/2021   lisinopril (ZESTRIL) 20 MG tablet Take 1 tablet (20 mg total) by mouth daily. Decreased from twice a day. (Patient taking differently: Take 20 mg by mouth in the morning and at bedtime. Decreased from twice a day.)  0 05/09/2021   lovastatin (MEVACOR) 20 MG tablet Take 20 mg by mouth daily with supper.   0 05/09/2021   metoprolol tartrate (LOPRESSOR) 25 MG tablet Take 25 mg by mouth 2 (two) times daily.    05/10/2021   omeprazole (PRILOSEC) 20 MG capsule Take 20 mg by mouth daily.  1 05/10/2021   ondansetron (ZOFRAN ODT) 4 MG disintegrating tablet Take 1 tablet (4 mg total) by mouth every 6 (six) hours as needed for  nausea or vomiting. (Patient not taking: Reported on 04/30/2021) 20 tablet 0 Not Taking   Allergies  Allergen Reactions   Hydrocodone Nausea Only   Tramadol Nausea Only   Past Medical History:  Diagnosis Date   Arthritis    Breast cancer ([place]) 2002   Right breast ca with chemo and rad tx. Left breast ca 1995 with rad tx.    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[place])    Past Surgical History:  Procedure Laterality Date   BREAST BIOPSY Left 04/18/2021   stereo calcs anterior,x marker, path pending   BREAST BIOPSY Left 04/18/2021   stereo calcs posterior, no marker placed, path pending   BREAST EXCISIONAL BIOPSY Left 1995   postive for breast ca. lumpectomy and rad tx   COLONOSCOPY WITH PROPOFOL N/A 04/17/2021   Procedure: COLONOSCOPY WITH PROPOFOL;  Surgeon: Robert Bellow, MD;  Location: [facility];  Service: Endoscopy;  Laterality: N/A;   EYE SURGERY     MASTECTOMY Right 2002   Breast ca   RENAL ANGIOGRAPHY N/A 05/13/2019   Procedure: RENAL ANGIOGRAPHY;  Surgeon: Katha Cabal, MD;  Location: [facility];  Service: Cardiovascular;  Laterality: N/A;   TONSILLECTOMY     UPPER GI ENDOSCOPY     Social History   Socioeconomic History   Marital status: Widowed    Spouse name: Not on file   Number of children: Not on file   Years of education: Not on file   Highest education level: Not on file  Occupational History   Not on file  Tobacco Use   Smoking status: Former   Smokeless tobacco: Never  Substance and Sexual Activity   Alcohol use: No    Alcohol/week: 0.0 standard drinks   Drug use: Never   Sexual activity: Not on file   Other Topics Concern   Not on file  Social History Narrative   Lives alone   Social Determinants of Health   Financial Resource Strain: Not on file  Food Insecurity: Not on file  Transportation Needs: Not on file  Physical Activity: Not on file  Stress: Not on file  Social Connections: Not on file  Intimate Partner Violence: Not on file   Social History   Social History Narrative   Lives alone     ROS: Negative.     PE: HEENT: Negative. Lungs: Clear. Cardio: RR.   Assessment/Plan:  Proceed with planned left mastectomy and sentinel node biopsy.  [name] [facility] 05/10/2021

## 2021-05-10 NOTE — Discharge Instructions (Signed)

## 2021-05-15 ENCOUNTER — Other Ambulatory Visit: Payer: Self-pay | Admitting: Anatomic Pathology & Clinical Pathology

## 2021-05-15 LAB — SURGICAL PATHOLOGY

## 2021-05-23 DIAGNOSIS — Z9012 Acquired absence of left breast and nipple: Secondary | ICD-10-CM | POA: Diagnosis not present

## 2021-05-23 DIAGNOSIS — C50112 Malignant neoplasm of central portion of left female breast: Secondary | ICD-10-CM | POA: Diagnosis not present

## 2021-06-03 DIAGNOSIS — I10 Essential (primary) hypertension: Secondary | ICD-10-CM | POA: Diagnosis not present

## 2021-06-03 DIAGNOSIS — N1832 Chronic kidney disease, stage 3b: Secondary | ICD-10-CM | POA: Diagnosis not present

## 2021-06-03 DIAGNOSIS — E1122 Type 2 diabetes mellitus with diabetic chronic kidney disease: Secondary | ICD-10-CM | POA: Diagnosis not present

## 2021-06-17 DIAGNOSIS — E119 Type 2 diabetes mellitus without complications: Secondary | ICD-10-CM | POA: Diagnosis not present

## 2021-06-17 DIAGNOSIS — E034 Atrophy of thyroid (acquired): Secondary | ICD-10-CM | POA: Diagnosis not present

## 2021-06-24 DIAGNOSIS — I129 Hypertensive chronic kidney disease with stage 1 through stage 4 chronic kidney disease, or unspecified chronic kidney disease: Secondary | ICD-10-CM | POA: Diagnosis not present

## 2021-06-24 DIAGNOSIS — E1122 Type 2 diabetes mellitus with diabetic chronic kidney disease: Secondary | ICD-10-CM | POA: Diagnosis not present

## 2021-06-24 DIAGNOSIS — N183 Chronic kidney disease, stage 3 unspecified: Secondary | ICD-10-CM | POA: Diagnosis not present

## 2021-06-24 DIAGNOSIS — E034 Atrophy of thyroid (acquired): Secondary | ICD-10-CM | POA: Diagnosis not present

## 2021-06-24 DIAGNOSIS — E7800 Pure hypercholesterolemia, unspecified: Secondary | ICD-10-CM | POA: Diagnosis not present

## 2021-07-03 DIAGNOSIS — I10 Essential (primary) hypertension: Secondary | ICD-10-CM | POA: Diagnosis not present

## 2021-07-09 DIAGNOSIS — Z87898 Personal history of other specified conditions: Secondary | ICD-10-CM | POA: Diagnosis not present

## 2021-08-14 DIAGNOSIS — M5432 Sciatica, left side: Secondary | ICD-10-CM | POA: Diagnosis not present

## 2021-09-16 DIAGNOSIS — U071 COVID-19: Secondary | ICD-10-CM | POA: Diagnosis not present

## 2021-10-18 DIAGNOSIS — E034 Atrophy of thyroid (acquired): Secondary | ICD-10-CM | POA: Diagnosis not present

## 2021-10-18 DIAGNOSIS — E119 Type 2 diabetes mellitus without complications: Secondary | ICD-10-CM | POA: Diagnosis not present

## 2021-10-18 DIAGNOSIS — M8588 Other specified disorders of bone density and structure, other site: Secondary | ICD-10-CM | POA: Diagnosis not present

## 2021-10-28 DIAGNOSIS — Z0000 Encounter for general adult medical examination without abnormal findings: Secondary | ICD-10-CM | POA: Diagnosis not present

## 2021-10-28 DIAGNOSIS — E1122 Type 2 diabetes mellitus with diabetic chronic kidney disease: Secondary | ICD-10-CM | POA: Diagnosis not present

## 2021-10-28 DIAGNOSIS — N183 Chronic kidney disease, stage 3 unspecified: Secondary | ICD-10-CM | POA: Diagnosis not present

## 2021-10-28 DIAGNOSIS — M353 Polymyalgia rheumatica: Secondary | ICD-10-CM | POA: Diagnosis not present

## 2021-10-28 DIAGNOSIS — Z1389 Encounter for screening for other disorder: Secondary | ICD-10-CM | POA: Diagnosis not present

## 2021-10-28 DIAGNOSIS — E7800 Pure hypercholesterolemia, unspecified: Secondary | ICD-10-CM | POA: Diagnosis not present

## 2021-10-28 DIAGNOSIS — I129 Hypertensive chronic kidney disease with stage 1 through stage 4 chronic kidney disease, or unspecified chronic kidney disease: Secondary | ICD-10-CM | POA: Diagnosis not present

## 2021-10-28 DIAGNOSIS — E119 Type 2 diabetes mellitus without complications: Secondary | ICD-10-CM | POA: Diagnosis not present

## 2021-11-04 ENCOUNTER — Encounter: Payer: Self-pay | Admitting: Emergency Medicine

## 2021-11-04 ENCOUNTER — Other Ambulatory Visit: Payer: Self-pay

## 2021-11-04 ENCOUNTER — Ambulatory Visit
Admission: EM | Admit: 2021-11-04 | Discharge: 2021-11-04 | Disposition: A | Discharge status: Home / Self Care | Payer: Medicare HMO | Attending: Internal Medicine | Admitting: Internal Medicine

## 2021-11-04 DIAGNOSIS — R030 Elevated blood-pressure reading, without diagnosis of hypertension: Secondary | ICD-10-CM | POA: Diagnosis not present

## 2021-11-04 DIAGNOSIS — F411 Generalized anxiety disorder: Secondary | ICD-10-CM

## 2021-11-04 MED ORDER — HYDROXYZINE HCL 10 MG PO TABS: 10.0000 mg | ORAL_TABLET | Freq: Three times a day (TID) | ORAL | 0 refills | Status: AC | PRN

## 2021-11-04 NOTE — ED Triage Notes (Signed)
Pt received some bad news from her family and is now experiencing HTN that is normally very well controlled with medication. Pt reports 315 systolic at home and cannot seem to get it down. She is not symptomatic currently other than feeling anxious.

## 2021-11-04 NOTE — ED Provider Notes (Signed)
[facility]    CSN: [id number] Arrival date & time: 11/04/21  1034      History   Chief Complaint Chief Complaint  Patient presents with   Hypertension    [name] is a 81 y.o. female who presents with elevated BP since she received bad new from her family this am. Yesterday her BP was normal.  Her systolic number at home has been 190's, normally rans 130-132/ 70's. She denies chest pain, thigh her lower breast bone is tender. Denies HA or ringing in her ears.  She also has been under a lot of stress due to different personal things that is going on.  Had a normal HTN visit with her PCP last week. Had labs done as well.   Past Medical History:  Diagnosis Date   Arthritis    Breast cancer ([place]) 2002   Right breast ca with chemo and rad tx. Left breast ca 1995 with rad tx.    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[place])     Patient Active Problem List   Diagnosis Date Noted   Hypotension due to drugs 03/13/2021   Ileitis 03/13/2021   Malignant hypertension 05/09/2019   CRF (chronic renal failure), stage 3 (moderate) ([place]) 07/14/2017   CKD stage 3 secondary to diabetes ([place]) 01/08/2017   Syncope 01/26/2016   Benign essential hypertension 06/19/2014   Hypercholesterolemia 06/19/2014   Hypothyroidism due to acquired atrophy of thyroid 06/19/2014   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 lumpectomy and rad tx   COLONOSCOPY WITH PROPOFOL N/A 04/17/2021   Procedure: COLONOSCOPY WITH PROPOFOL;  Surgeon: Robert Bellow, MD;  Location: [facility];  Service: Endoscopy;  Laterality: N/A;   EYE SURGERY     MASTECTOMY Right 2002   Breast ca   RENAL ANGIOGRAPHY N/A 05/13/2019   Procedure: RENAL ANGIOGRAPHY;  Surgeon: Katha Cabal, MD;  Location: [place] [facility];  Service: Cardiovascular;  Laterality: N/A;   SIMPLE MASTECTOMY WITH AXILLARY SENTINEL NODE BIOPSY Left 05/10/2021   Procedure: SIMPLE MASTECTOMY;  Surgeon: Robert Bellow, MD;  Location: [facility] ORS;  Service: General;  Laterality: Left;   TONSILLECTOMY     UPPER GI ENDOSCOPY      OB History   No obstetric history on file.      Home Medications    Prior to Admission medications   Medication Sig Start Date End Date Taking? Authorizing Provider  hydrOXYzine (ATARAX) 10 MG tablet Take 1 tablet (10 mg total) by mouth 3 (three) times daily as needed. For anxiety and stress 11/04/21  Yes Rodriguez-Southworth, Sunday Spillers, PA-C  alendronate (FOSAMAX) 70 MG tablet Take 70 mg by mouth every Monday.    [provider]  aspirin EC 81 MG  tablet Take 81 mg by mouth daily. Swallow whole.    [provider]  azelastine (OPTIVAR) 0.05 % ophthalmic solution Place 1 drop into both eyes in the morning and at bedtime. 04/15/21   [provider]  cloNIDine (CATAPRES) 0.1 MG tablet Take 0.2 mg by mouth 2 (two) times daily.    [provider]  dicyclomine (BENTYL) 20 MG tablet Take 1 tablet (20 mg total) by mouth every 8 (eight) hours as needed for spasms (Abdominal cramping). 03/13/21   Ward, Delice Bison, DO  doxazosin (CARDURA) 4 MG tablet Take 1 tablet (4 mg total) by mouth every evening. 03/15/21   Enzo Bi, MD  gabapentin (NEURONTIN) 100 MG capsule Take 100 mg by mouth 3 (three) times daily.     [provider]  glipiZIDE (GLUCOTROL) 5 MG tablet Take 2.5 mg by mouth daily before breakfast.    [provider]  hydrALAZINE (APRESOLINE) 50 MG tablet Take 50 mg by mouth 3 (three) times daily.    [provider]  levothyroxine (SYNTHROID) 88 MCG tablet Take 88 mcg by mouth daily before breakfast.    [provider]  lisinopril (ZESTRIL) 20 MG tablet Take 1 tablet (20 mg total) by mouth daily. Decreased from twice a day. Patient taking differently: Take 20 mg by mouth in the morning and at bedtime. Decreased from twice a day. 03/14/21   Enzo Bi, MD  lovastatin (MEVACOR) 20 MG tablet Take 20 mg by mouth daily with supper.  01/08/16   [provider]  metoprolol tartrate (LOPRESSOR) 25 MG tablet Take 25 mg by mouth 2 (two) times daily.  04/09/19   [provider]  omeprazole (PRILOSEC) 20 MG capsule Take 20 mg by mouth daily.    [provider]  traMADol (ULTRAM) 50 MG tablet Take 1 tablet (50 mg total) by mouth every 6 (six) hours as needed. 05/10/21 05/10/22  Robert Bellow, MD    Family History Family History  Problem Relation Age of Onset   Hypertension Other    Breast cancer Neg Hx     Social History Social History   Tobacco Use   Smoking status: Former   Smokeless tobacco: Never  Substance Use Topics   Alcohol use: No    Alcohol/week: 0.0 standard drinks   Drug use: Never     Allergies   Hydrocodone and Tramadol   Review of Systems Review of Systems Review of Systems  Constitutional: Negative for diaphoresis and unexpected weight change.  HENT: Negative for tinnitus.   Eyes: Negative for visual disturbance.  Respiratory: Negative for chest tightness and shortness of breath.   Cardiovascular: Negative for chest pain, palpitations and leg swelling.  Gastrointestinal: Negative for constipation, diarrhea and nausea.  Endocrine: Negative for polydipsia, polyphagia and polyuria.  Genitourinary: Negative for  dysuria and frequency.  Skin: Negative for rash and wound.  Neurological: Negative for dizziness, speech difficulty, weakness, numbness and headaches.   Psych- high stress Physical Exam Triage Vital Signs ED Triage Vitals  Enc Vitals Group     BP 11/04/21 1049 (!) 213/81     Pulse Rate 11/04/21 1049 66     Resp 11/04/21 1049 16     Temp 11/04/21 1049 97.9 F (36.6 C)     Temp Source 11/04/21 1049 Oral     SpO2 11/04/21 1049 96 %     Weight --      Height --      Head Circumference --  Peak Flow --      Pain Score 11/04/21 1055 0     Pain Loc --      Pain Edu? --      Excl. in [place]? --    No data found.  Updated Vital Signs BP (!) 213/81 (BP Location: Left Arm)    Pulse 66    Temp 97.9 F (36.6 C) (Oral)    Resp 16    SpO2 96%   Visual Acuity Right Eye Distance:   Left Eye Distance:   Bilateral Distance:    Right Eye Near:   Left Eye Near:    Bilateral Near:     Physical Exam  Constitutional: She is oriented to person, place, and time. She appears well-developed and well-nourished. No distress.  HENT:  Head: Normocephalic and atraumatic.  Right Ear: External ear normal.  Left Ear: External ear normal.  Nose: Nose normal.  Eyes: Conjunctivae are normal. Right eye exhibits no discharge. Left eye exhibits no discharge. No scleral icterus.  Neck: Neck supple. No thyromegaly present.  No carotid bruits bilaterally  Cardiovascular: Normal rate and regular rhythm.  1/6 murmur heard. Pulmonary/Chest: Effort normal and breath sounds normal. No respiratory distress.  Musculoskeletal: Normal range of motion. She exhibits no edema. Her xyphoid is tender to palpation.  Lymphadenopathy: She has no cervical adenopathy.  Neurological: She is alert and oriented to person, place, and time.  Skin: Skin is warm and dry. Capillary refill takes less than 2 seconds. No rash noted. She is not diaphoretic.  Psychiatric: She has a normal mood and affect. Her behavior is normal.  Judgment and thought content normal.  Nursing note reviewed. I Had her do deep breath exercises and I repeated her BP and dropped to 168/68  UC Treatments / Results  Labs (all labs ordered are listed, but only abnormal results are displayed) Labs Reviewed - No data to display  EKG   Radiology No results found.  Procedures Procedures (including critical care time)  Medications Ordered in UC Medications - No data to display  Initial Impression / Assessment and Plan / UC Course  I have reviewed the triage vital signs and the nursing notes. Elevated BP secondary to stress and anxiety I placed her on Hydroxyzine 10 mg tid prn anxiety, and told her to try one when she gets home, and if it causes her sedation, to only take 1/2 of one next time. See instructions.   Final Clinical Impressions(s) / UC Diagnoses   Final diagnoses:  Anxiety state  Transient elevated blood pressure     Discharge Instructions      Do deep breath exercises when you feel overwhelmed and your blood pressure is elevated. If it continues to stay over 140/90, please call your primary care doctor for follow up to see if temperately your blood pressure medication needs to be increased.      ED Prescriptions     Medication Sig Dispense Auth. Provider   hydrOXYzine (ATARAX) 10 MG tablet Take 1 tablet (10 mg total) by mouth 3 (three) times daily as needed. For anxiety and stress 30 tablet Rodriguez-Southworth, Sunday Spillers, PA-C      PDMP not reviewed this encounter.   Shelby Mattocks, PA-C 11/04/21 1128

## 2021-11-04 NOTE — Discharge Instructions (Addendum)
Do deep breath exercises when you feel overwhelmed and your blood pressure is elevated. If it continues to stay over 140/90, please call your primary care doctor for follow up to see if temperately your blood pressure medication needs to be increased.

## 2021-11-21 DIAGNOSIS — C50112 Malignant neoplasm of central portion of left female breast: Secondary | ICD-10-CM | POA: Diagnosis not present

## 2021-11-21 DIAGNOSIS — I951 Orthostatic hypotension: Secondary | ICD-10-CM | POA: Diagnosis not present

## 2021-11-25 IMAGING — CT CT ABD-PELV W/O CM
2 of 4 series · 17 of 46 positions shown, 19 images · non-contrast
Comparison: 11/30/2020

CLINICAL DATA: Acute nonlocalized abdominal pain. Episode of
diarrhea.

EXAM:
CT ABDOMEN AND PELVIS WITHOUT CONTRAST
TECHNIQUE: Multidetector CT imaging of the abdomen and pelvis was performed
following the standard protocol without IV contrast.

[Series 2: routine abd/pel wo · axial · 0.73mm/px · z∈[-796,-406]mm · 14 of 86 slices shown, 16 images]
[im 4/86  soft-tissue]
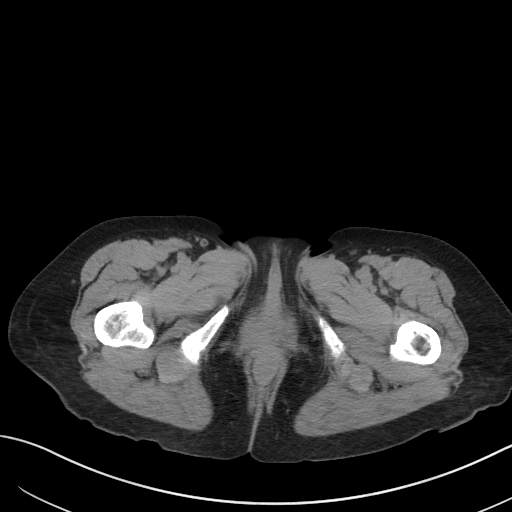
[im 4/86  bone]
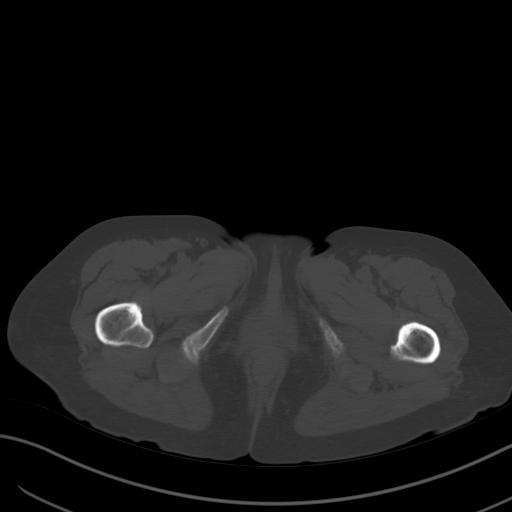
[im 11/86  soft-tissue]
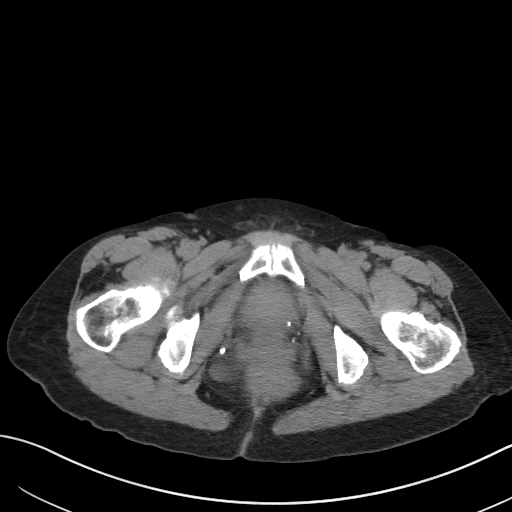
[im 18/86  soft-tissue]
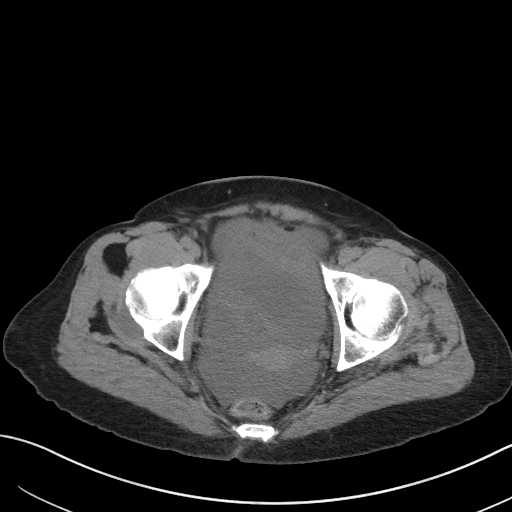
[im 22/86  soft-tissue]
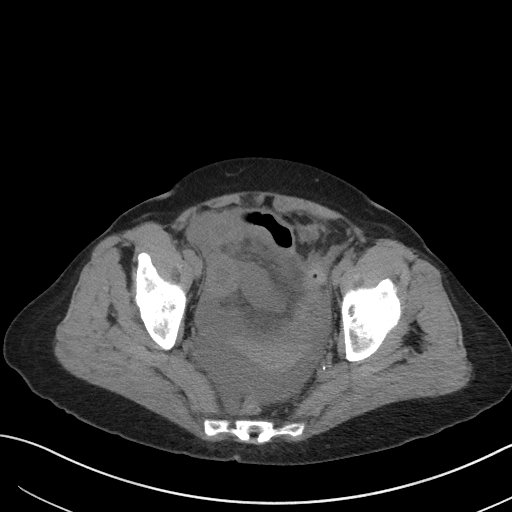
[im 29/86  soft-tissue]
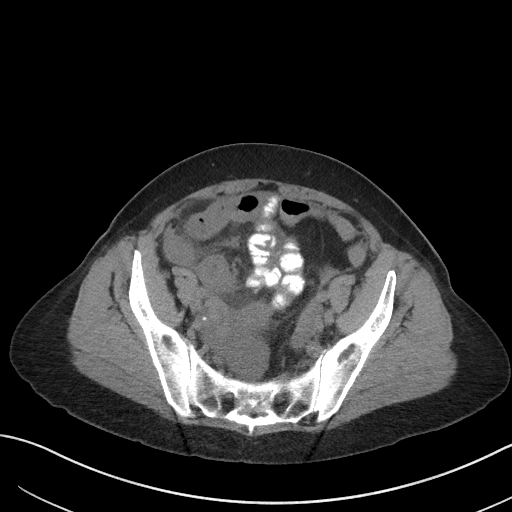
[im 36/86  soft-tissue]
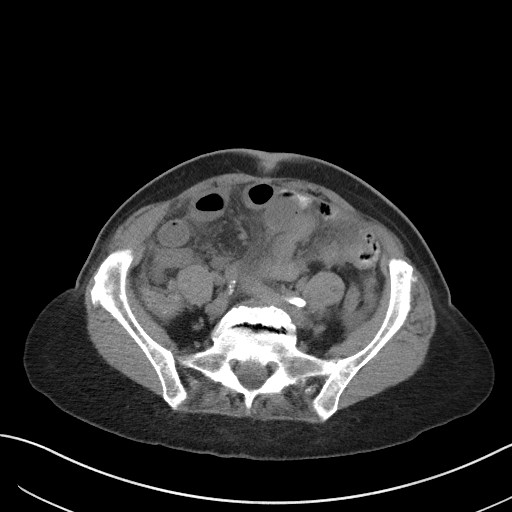
[im 39/86  soft-tissue]
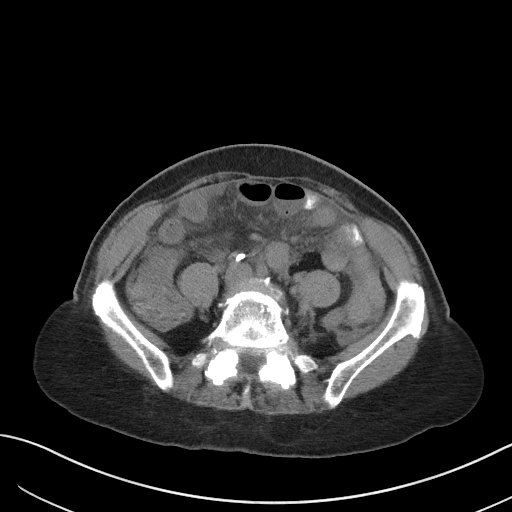
[im 47/86  soft-tissue]
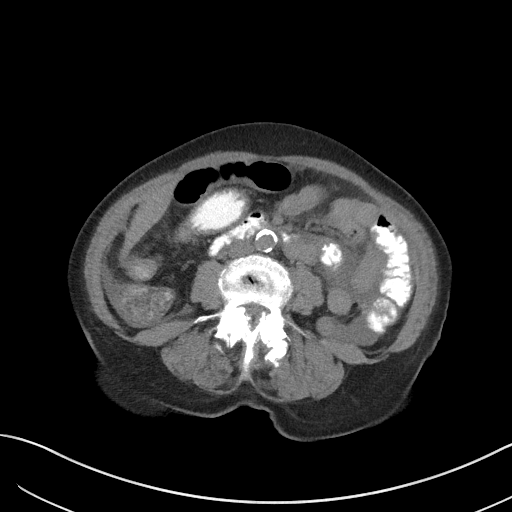
[im 50/86  soft-tissue]
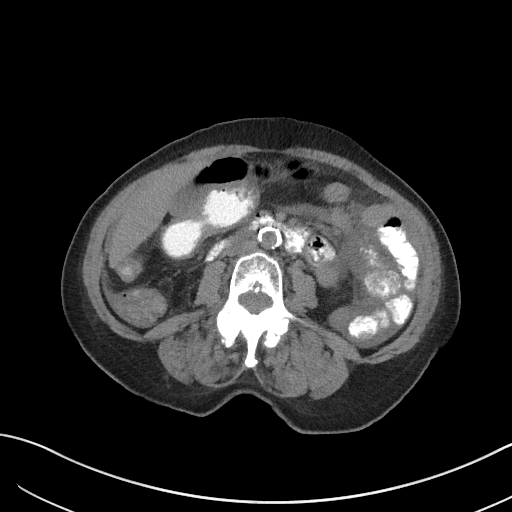
[im 50/86  bone]
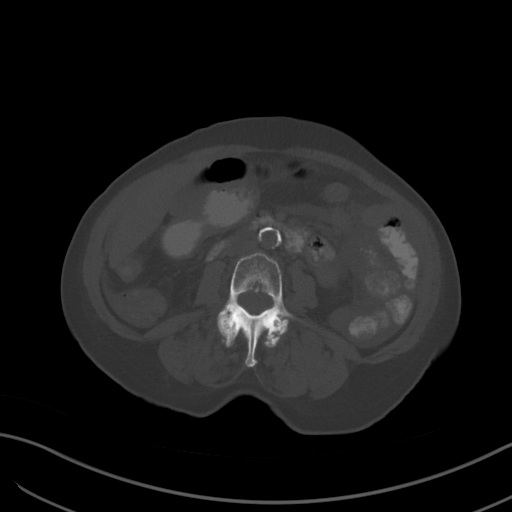
[im 57/86  soft-tissue]
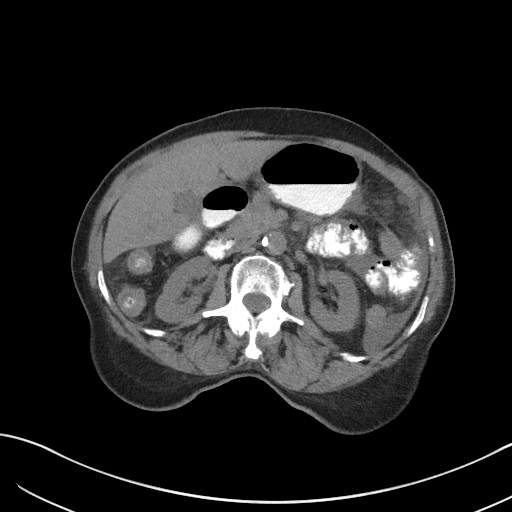
[im 64/86  soft-tissue]
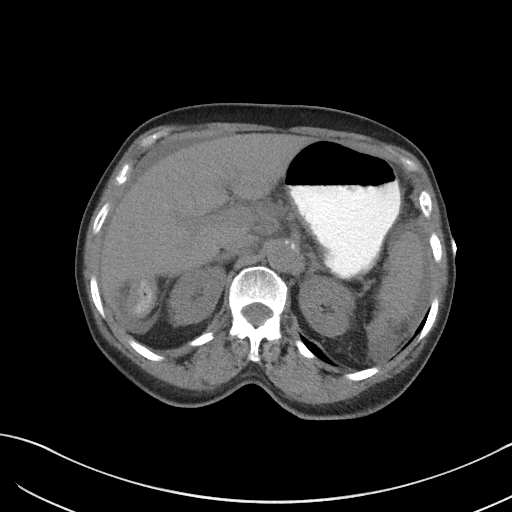
[im 68/86  soft-tissue]
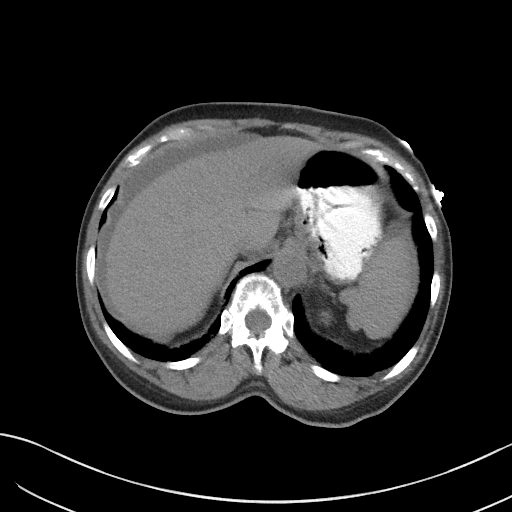
[im 75/86  soft-tissue]
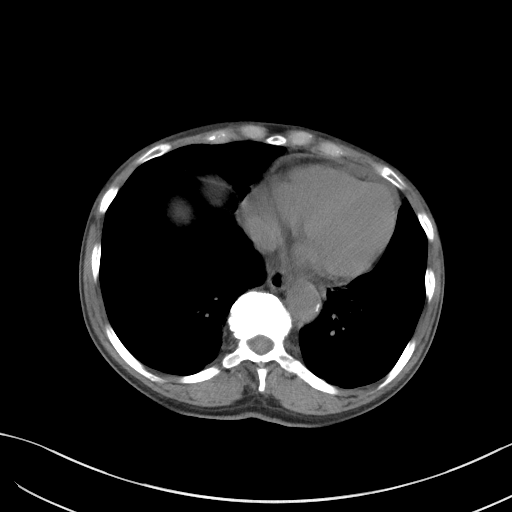
[im 82/86  soft-tissue]
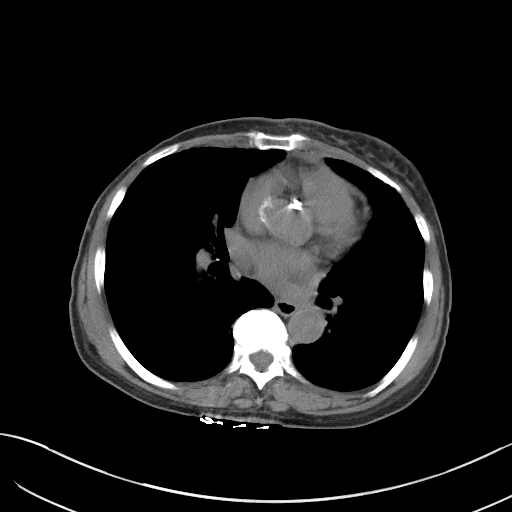

[Series 5: coronal st · coronal · 0.75mm/px · 3 of 89 slices shown]
[im 30/89  soft-tissue]
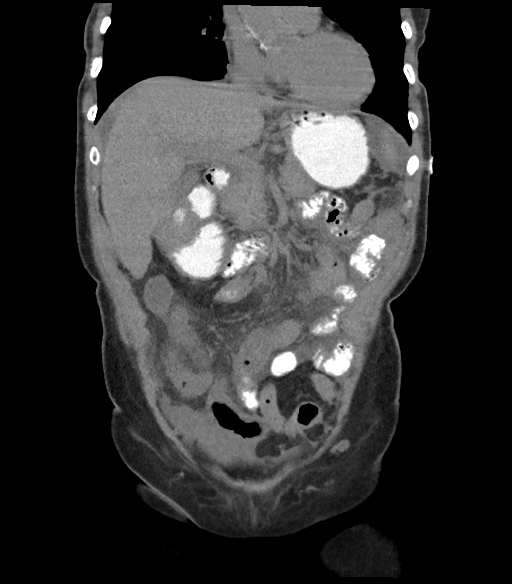
[im 40/89  soft-tissue]
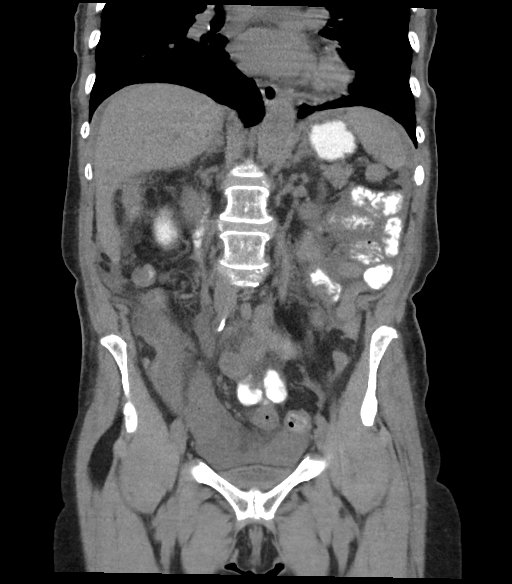
[im 49/89  soft-tissue]
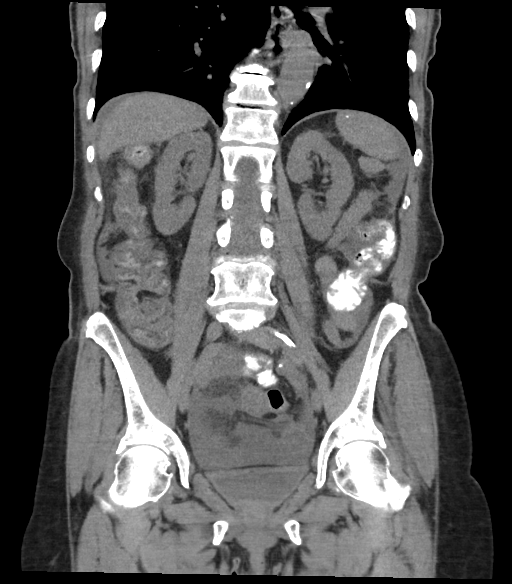

[17 of 46 positions shown; findings below may reference images not displayed]

FINDINGS: Lower chest: Small volume low-density pericardial effusion, chronic.
Mild scarring at the lung bases. Right mastectomy.

Hepatobiliary: No focal liver abnormality.No evidence of biliary
obstruction or stone.

Pancreas: Unremarkable.

Spleen: Prior injury with scarring and calcification superiorly.

Adrenals/Urinary Tract: Negative adrenals. No hydronephrosis or
stone. Unremarkable bladder.

Stomach/Bowel: Ileal wall thickening in the pelvis and right lower
quadrant with mesenteric fat stranding. No appendicitis or colonic
thickening. No obstruction.

Vascular/Lymphatic: Multifocal atheromatous plaque. No mass or
adenopathy.

Reproductive:No pathologic findings.

Other: Small volume ascites spanning pelvis to diaphragm, presumably
reactive.

Musculoskeletal: Severe lower thoracic disc degeneration. Severe
lumbar facet osteoarthritis with L4-5 anterolisthesis. L3-4 and L4-5
advanced spinal stenosis.
IMPRESSION: 1. Ileitis without specific feature.
2. Small volume ascites presumably reactive to #1.
3.  Aortic Atherosclerosis ([id number].E).

## 2021-12-09 DIAGNOSIS — E1122 Type 2 diabetes mellitus with diabetic chronic kidney disease: Secondary | ICD-10-CM | POA: Diagnosis not present

## 2021-12-09 DIAGNOSIS — N1832 Chronic kidney disease, stage 3b: Secondary | ICD-10-CM | POA: Diagnosis not present

## 2021-12-09 DIAGNOSIS — I10 Essential (primary) hypertension: Secondary | ICD-10-CM | POA: Diagnosis not present

## 2022-02-17 DIAGNOSIS — E034 Atrophy of thyroid (acquired): Secondary | ICD-10-CM | POA: Diagnosis not present

## 2022-02-17 DIAGNOSIS — E119 Type 2 diabetes mellitus without complications: Secondary | ICD-10-CM | POA: Diagnosis not present

## 2022-02-25 DIAGNOSIS — E1122 Type 2 diabetes mellitus with diabetic chronic kidney disease: Secondary | ICD-10-CM | POA: Diagnosis not present

## 2022-02-25 DIAGNOSIS — E7800 Pure hypercholesterolemia, unspecified: Secondary | ICD-10-CM | POA: Diagnosis not present

## 2022-02-25 DIAGNOSIS — I129 Hypertensive chronic kidney disease with stage 1 through stage 4 chronic kidney disease, or unspecified chronic kidney disease: Secondary | ICD-10-CM | POA: Diagnosis not present

## 2022-02-25 DIAGNOSIS — N183 Chronic kidney disease, stage 3 unspecified: Secondary | ICD-10-CM | POA: Diagnosis not present

## 2022-02-25 DIAGNOSIS — E034 Atrophy of thyroid (acquired): Secondary | ICD-10-CM | POA: Diagnosis not present

## 2022-02-25 DIAGNOSIS — Z87891 Personal history of nicotine dependence: Secondary | ICD-10-CM | POA: Diagnosis not present

## 2022-02-25 DIAGNOSIS — E1140 Type 2 diabetes mellitus with diabetic neuropathy, unspecified: Secondary | ICD-10-CM | POA: Diagnosis not present

## 2022-02-25 DIAGNOSIS — Z0001 Encounter for general adult medical examination with abnormal findings: Secondary | ICD-10-CM | POA: Diagnosis not present

## 2022-04-16 DIAGNOSIS — E119 Type 2 diabetes mellitus without complications: Secondary | ICD-10-CM | POA: Diagnosis not present

## 2022-04-16 DIAGNOSIS — M2041 Other hammer toe(s) (acquired), right foot: Secondary | ICD-10-CM | POA: Diagnosis not present

## 2022-04-16 DIAGNOSIS — L600 Ingrowing nail: Secondary | ICD-10-CM | POA: Diagnosis not present

## 2022-04-16 DIAGNOSIS — M79675 Pain in left toe(s): Secondary | ICD-10-CM | POA: Diagnosis not present

## 2022-04-16 DIAGNOSIS — M79674 Pain in right toe(s): Secondary | ICD-10-CM | POA: Diagnosis not present

## 2022-04-16 DIAGNOSIS — M2042 Other hammer toe(s) (acquired), left foot: Secondary | ICD-10-CM | POA: Diagnosis not present

## 2022-04-16 DIAGNOSIS — B351 Tinea unguium: Secondary | ICD-10-CM | POA: Diagnosis not present

## 2022-04-23 DIAGNOSIS — J019 Acute sinusitis, unspecified: Secondary | ICD-10-CM | POA: Diagnosis not present

## 2022-04-23 DIAGNOSIS — B9689 Other specified bacterial agents as the cause of diseases classified elsewhere: Secondary | ICD-10-CM | POA: Diagnosis not present

## 2022-05-11 DIAGNOSIS — R519 Headache, unspecified: Secondary | ICD-10-CM | POA: Diagnosis not present

## 2022-05-11 DIAGNOSIS — B9689 Other specified bacterial agents as the cause of diseases classified elsewhere: Secondary | ICD-10-CM | POA: Diagnosis not present

## 2022-05-11 DIAGNOSIS — J019 Acute sinusitis, unspecified: Secondary | ICD-10-CM | POA: Diagnosis not present

## 2022-05-11 DIAGNOSIS — L819 Disorder of pigmentation, unspecified: Secondary | ICD-10-CM | POA: Diagnosis not present

## 2022-06-16 DIAGNOSIS — N1832 Chronic kidney disease, stage 3b: Secondary | ICD-10-CM | POA: Diagnosis not present

## 2022-06-16 DIAGNOSIS — E1122 Type 2 diabetes mellitus with diabetic chronic kidney disease: Secondary | ICD-10-CM | POA: Diagnosis not present

## 2022-06-16 DIAGNOSIS — I10 Essential (primary) hypertension: Secondary | ICD-10-CM | POA: Diagnosis not present

## 2022-06-24 DIAGNOSIS — C50112 Malignant neoplasm of central portion of left female breast: Secondary | ICD-10-CM | POA: Diagnosis not present

## 2022-06-25 DIAGNOSIS — E034 Atrophy of thyroid (acquired): Secondary | ICD-10-CM | POA: Diagnosis not present

## 2022-06-25 DIAGNOSIS — E119 Type 2 diabetes mellitus without complications: Secondary | ICD-10-CM | POA: Diagnosis not present

## 2022-07-02 DIAGNOSIS — E1122 Type 2 diabetes mellitus with diabetic chronic kidney disease: Secondary | ICD-10-CM | POA: Diagnosis not present

## 2022-07-02 DIAGNOSIS — I129 Hypertensive chronic kidney disease with stage 1 through stage 4 chronic kidney disease, or unspecified chronic kidney disease: Secondary | ICD-10-CM | POA: Diagnosis not present

## 2022-07-02 DIAGNOSIS — E7800 Pure hypercholesterolemia, unspecified: Secondary | ICD-10-CM | POA: Diagnosis not present

## 2022-07-02 DIAGNOSIS — E1140 Type 2 diabetes mellitus with diabetic neuropathy, unspecified: Secondary | ICD-10-CM | POA: Diagnosis not present

## 2022-07-02 DIAGNOSIS — N183 Chronic kidney disease, stage 3 unspecified: Secondary | ICD-10-CM | POA: Diagnosis not present

## 2022-07-02 DIAGNOSIS — E034 Atrophy of thyroid (acquired): Secondary | ICD-10-CM | POA: Diagnosis not present

## 2022-07-07 ENCOUNTER — Encounter (INDEPENDENT_AMBULATORY_CARE_PROVIDER_SITE_OTHER): Payer: Self-pay

## 2022-08-13 DIAGNOSIS — J019 Acute sinusitis, unspecified: Secondary | ICD-10-CM | POA: Diagnosis not present

## 2022-08-13 DIAGNOSIS — J209 Acute bronchitis, unspecified: Secondary | ICD-10-CM | POA: Diagnosis not present

## 2022-08-13 DIAGNOSIS — R110 Nausea: Secondary | ICD-10-CM | POA: Diagnosis not present

## 2022-08-13 DIAGNOSIS — B9689 Other specified bacterial agents as the cause of diseases classified elsewhere: Secondary | ICD-10-CM | POA: Diagnosis not present

## 2022-08-13 DIAGNOSIS — Z03818 Encounter for observation for suspected exposure to other biological agents ruled out: Secondary | ICD-10-CM | POA: Diagnosis not present

## 2022-08-15 DIAGNOSIS — R220 Localized swelling, mass and lump, head: Secondary | ICD-10-CM | POA: Diagnosis present

## 2022-08-15 DIAGNOSIS — T464X5A Adverse effect of angiotensin-converting-enzyme inhibitors, initial encounter: Secondary | ICD-10-CM | POA: Diagnosis not present

## 2022-08-15 DIAGNOSIS — T783XXA Angioneurotic edema, initial encounter: Secondary | ICD-10-CM | POA: Insufficient documentation

## 2022-08-15 LAB — CBC WITH DIFFERENTIAL/PLATELET
Abs Immature Granulocytes: 0.01 10*3/uL (ref 0.00–0.07)
Basophils Absolute: 0 10*3/uL (ref 0.0–0.1)
Basophils Relative: 0 %
Eosinophils Absolute: 0 10*3/uL (ref 0.0–0.5)
Eosinophils Relative: 0 %
HCT: 33.1 % — ABNORMAL LOW (ref 36.0–46.0)
Hemoglobin: 10.6 g/dL — ABNORMAL LOW (ref 12.0–15.0)
Immature Granulocytes: 0 %
Lymphocytes Relative: 24 %
Lymphs Abs: 0.6 10*3/uL — ABNORMAL LOW (ref 0.7–4.0)
MCH: 27.3 pg (ref 26.0–34.0)
MCHC: 32 g/dL (ref 30.0–36.0)
MCV: 85.3 fL (ref 80.0–100.0)
Monocytes Absolute: 0.1 10*3/uL (ref 0.1–1.0)
Monocytes Relative: 3 %
Neutro Abs: 1.9 10*3/uL (ref 1.7–7.7)
Neutrophils Relative %: 73 %
Platelets: 223 10*3/uL (ref 150–400)
RBC: 3.88 MIL/uL (ref 3.87–5.11)
RDW: 12.7 % (ref 11.5–15.5)
WBC: 2.7 10*3/uL — ABNORMAL LOW (ref 4.0–10.5)
nRBC: 0 % (ref 0.0–0.2)

## 2022-08-15 LAB — BASIC METABOLIC PANEL
Anion gap: 10 (ref 5–15)
BUN: 22 mg/dL (ref 8–23)
CO2: 23 mmol/L (ref 22–32)
Calcium: 8.6 mg/dL — ABNORMAL LOW (ref 8.9–10.3)
Chloride: 103 mmol/L (ref 98–111)
Creatinine, Ser: 1.88 mg/dL — ABNORMAL HIGH (ref 0.44–1.00)
GFR, Estimated: 26 mL/min — ABNORMAL LOW (ref >60)
Glucose, Bld: 220 mg/dL — ABNORMAL HIGH (ref 70–99)
Potassium: 4.3 mmol/L (ref 3.5–5.1)
Sodium: 136 mmol/L (ref 135–145)

## 2022-08-15 NOTE — ED Triage Notes (Signed)
Pt sts that she takes lisinopril for many years. Pt has a swollen bottom lip. Pt denies any problems swallowing or breathing at this time.

## 2022-08-15 NOTE — ED Provider Triage Note (Signed)
Emergency Medicine Provider Triage Evaluation Note  Janice Galloway, a 81 y.o. female  was evaluated in triage.  Pt complains of sudden lower lip swelling. She denies food or drug allergies or any known triggers. She denies difficulty breathing, swallowing or controlling oral secretions. She has been on lisinopril for BP for years.   Review of Systems  Positive: angioedema Negative: SOB, cough  Physical Exam  BP (!) 203/71 (BP Location: Left Arm)   Pulse 77   Temp 97.8 F (36.6 C) (Oral)   Resp 18   Wt 59 kg   SpO2 94%   BMI 21.63 kg/m  Gen:   Awake, no distress  NAD HEENT: Unilateral lower lip swelling noted. Uvula is midline and tonsils are flat. No sublingual edema noted.  Resp:  Normal effort CTA MSK:   Moves extremities without difficulty  Other:    Medical Decision Making  Medically screening exam initiated at 9:50 PM.  Appropriate orders placed.  Janice Galloway was informed that the remainder of the evaluation will be completed by another provider, this initial triage assessment does not replace that evaluation, and the importance of remaining in the ED until their evaluation is complete.  Geriatric patient with HTN on longterm ACE-I use, presenting with sudden unilateral lower lip swelling.   Janice Needles, PA-C 08/20/22 0023

## 2022-08-16 ENCOUNTER — Emergency Department
Admission: EM | Admit: 2022-08-16 | Discharge: 2022-08-16 | Disposition: A | Discharge status: Home / Self Care | Payer: Medicare HMO | Attending: Physician Assistant | Admitting: Physician Assistant

## 2022-08-16 DIAGNOSIS — T783XXA Angioneurotic edema, initial encounter: Secondary | ICD-10-CM

## 2022-08-16 NOTE — ED Provider Notes (Signed)
[facility] Emergency Department Provider Note     None    (approximate)   History   Facial Swelling   HPI  Janice Galloway is a 81 y.o. female patient complains of sudden lower lip swelling. She denies food or drug allergies or any known triggers. She denies difficulty breathing, swallowing or controlling oral secretions. She has been on lisinopril for BP for years.     Physical Exam   Triage Vital Signs: ED Triage Vitals  Enc Vitals Group     BP 08/15/22 2137 (!) 203/71     Pulse Rate 08/15/22 2137 77     Resp 08/15/22 2137 18     Temp 08/15/22 2137 97.8 F (36.6 C)     Temp Source 08/15/22 2137 Oral     SpO2 08/15/22 2137 94 %     Weight 08/15/22 2138 130 lb (59 kg)     Height --      Head Circumference --      Peak Flow --      Pain Score --      Pain Loc --      Pain Edu? --      Excl. in [place]? --     Most recent vital signs: Vitals:   08/15/22 2137  BP: (!) 203/71  Pulse: 77  Resp: 18  Temp: 97.8 F (36.6 C)  SpO2: 94%    General Awake, no distress. NAD. Speaking in complete sentences HEENT NCAT. PERRL. EOMI. No rhinorrhea. Unilateral STS noted to the right lower lip without associated erythema or induration. Mucous membranes are moist. Uvula is midline and tonsils are flat. No apical or gingival abscesses noted. No brawny sublingual edema appreciated.  CV:  Good peripheral perfusion.  RESP:  Normal effort.  ABD:  No distention.    ED Results / Procedures / Treatments   Labs (all labs ordered are listed, but only abnormal results are displayed) Labs Reviewed  BASIC METABOLIC PANEL - Abnormal; Notable for the following components:      Result Value   Glucose, Bld 220 (*)    Creatinine, Ser 1.88 (*)    Calcium 8.6 (*)    GFR, Estimated 26 (*)    All other components within normal limits  CBC WITH DIFFERENTIAL/PLATELET - Abnormal; Notable for the following components:   WBC 2.7 (*)    Hemoglobin 10.6 (*)    HCT  33.1 (*)    Lymphs Abs 0.6 (*)    All other components within normal limits     EKG  RADIOLOGY   No results found.   PROCEDURES:  Critical Care performed: No  Procedures   MEDICATIONS ORDERED IN ED: Medications - No data to display   IMPRESSION / MDM / [place] / ED COURSE  I reviewed the triage vital signs and the nursing notes.                              Differential diagnosis includes, but is not limited to, angioedema, anaphylaxis, contact dermatitis, dental abscess, soft tissue abscess.   Patient's presentation is most consistent with acute complicated illness / injury requiring diagnostic workup.  Geriatric patient to the ED for sudden, unprovoked soft tissue swelling to the lower lip, unilaterally. She denies other concerning complaints like pain, fever, difficulty breathing, swallowing, or controlling oral secretions. The patient exam and workup are otherwise unremarkable. Labs do show a decreased  WBC from base. Her history does confirm long-term use of lisinopril. Patient's diagnosis is consistent with ACE-inhibitor induced angioedema. Patient will be discharged home with  instructions to immediately discontinue this medicine. Patient is to follow up with her PCP for further med management of her BP, and lower WBC as discussed. Patient is given ED precautions to return to the ED for any worsening or new symptoms.     FINAL CLINICAL IMPRESSION(S) / ED DIAGNOSES   Final diagnoses:  Angioedema due to angiotensin converting enzyme inhibitor (ACE-I)     Rx / DC Orders   ED Discharge Orders     None        Note:  This document was prepared using Dragon voice recognition software and may include unintentional dictation errors.    Melvenia Needles, PA-C 08/20/22 0034    Lucillie Garfinkel, MD 08/20/22 [phone]

## 2022-08-16 NOTE — ED Notes (Signed)
Discharge instructions given and reviewed by Scarlette Ar, PA discharged patient.

## 2022-08-16 NOTE — Discharge Instructions (Addendum)
Your exam is reassuring. Your symptoms seem to be due to a known side effect of your lisinopril. Discontinue this medicine immediately. Follow-up with your primary provider for ongoing medicine changes to your blood pressure medicines.

## 2022-10-22 DIAGNOSIS — Z0100 Encounter for examination of eyes and vision without abnormal findings: Secondary | ICD-10-CM | POA: Diagnosis not present

## 2022-10-22 DIAGNOSIS — H35033 Hypertensive retinopathy, bilateral: Secondary | ICD-10-CM | POA: Diagnosis not present

## 2022-10-22 DIAGNOSIS — H524 Presbyopia: Secondary | ICD-10-CM | POA: Diagnosis not present

## 2022-10-28 DIAGNOSIS — E119 Type 2 diabetes mellitus without complications: Secondary | ICD-10-CM | POA: Diagnosis not present

## 2022-10-28 DIAGNOSIS — E034 Atrophy of thyroid (acquired): Secondary | ICD-10-CM | POA: Diagnosis not present

## 2022-11-03 DIAGNOSIS — Z0000 Encounter for general adult medical examination without abnormal findings: Secondary | ICD-10-CM | POA: Diagnosis not present

## 2022-11-03 DIAGNOSIS — N183 Chronic kidney disease, stage 3 unspecified: Secondary | ICD-10-CM | POA: Diagnosis not present

## 2022-11-03 DIAGNOSIS — E1140 Type 2 diabetes mellitus with diabetic neuropathy, unspecified: Secondary | ICD-10-CM | POA: Diagnosis not present

## 2022-11-03 DIAGNOSIS — E034 Atrophy of thyroid (acquired): Secondary | ICD-10-CM | POA: Diagnosis not present

## 2022-11-03 DIAGNOSIS — I129 Hypertensive chronic kidney disease with stage 1 through stage 4 chronic kidney disease, or unspecified chronic kidney disease: Secondary | ICD-10-CM | POA: Diagnosis not present

## 2022-11-03 DIAGNOSIS — Z87891 Personal history of nicotine dependence: Secondary | ICD-10-CM | POA: Diagnosis not present

## 2022-11-03 DIAGNOSIS — E119 Type 2 diabetes mellitus without complications: Secondary | ICD-10-CM | POA: Diagnosis not present

## 2022-11-03 DIAGNOSIS — E1122 Type 2 diabetes mellitus with diabetic chronic kidney disease: Secondary | ICD-10-CM | POA: Diagnosis not present

## 2022-11-03 DIAGNOSIS — Z1331 Encounter for screening for depression: Secondary | ICD-10-CM | POA: Diagnosis not present

## 2022-11-03 DIAGNOSIS — M353 Polymyalgia rheumatica: Secondary | ICD-10-CM | POA: Diagnosis not present

## 2022-11-03 DIAGNOSIS — E7800 Pure hypercholesterolemia, unspecified: Secondary | ICD-10-CM | POA: Diagnosis not present

## 2022-11-06 ENCOUNTER — Other Ambulatory Visit: Payer: Self-pay | Admitting: Surgery

## 2022-11-06 DIAGNOSIS — Z853 Personal history of malignant neoplasm of breast: Secondary | ICD-10-CM

## 2022-11-24 DIAGNOSIS — Z853 Personal history of malignant neoplasm of breast: Secondary | ICD-10-CM | POA: Diagnosis not present

## 2022-11-24 DIAGNOSIS — Z08 Encounter for follow-up examination after completed treatment for malignant neoplasm: Secondary | ICD-10-CM | POA: Diagnosis not present

## 2022-12-15 DIAGNOSIS — I10 Essential (primary) hypertension: Secondary | ICD-10-CM | POA: Diagnosis not present

## 2022-12-15 DIAGNOSIS — D631 Anemia in chronic kidney disease: Secondary | ICD-10-CM | POA: Diagnosis not present

## 2022-12-15 DIAGNOSIS — N1832 Chronic kidney disease, stage 3b: Secondary | ICD-10-CM | POA: Diagnosis not present

## 2022-12-15 DIAGNOSIS — E1122 Type 2 diabetes mellitus with diabetic chronic kidney disease: Secondary | ICD-10-CM | POA: Diagnosis not present

## 2023-02-23 DIAGNOSIS — M47812 Spondylosis without myelopathy or radiculopathy, cervical region: Secondary | ICD-10-CM | POA: Diagnosis not present

## 2023-02-23 DIAGNOSIS — M542 Cervicalgia: Secondary | ICD-10-CM | POA: Diagnosis not present

## 2023-03-03 DIAGNOSIS — E034 Atrophy of thyroid (acquired): Secondary | ICD-10-CM | POA: Diagnosis not present

## 2023-03-03 DIAGNOSIS — E119 Type 2 diabetes mellitus without complications: Secondary | ICD-10-CM | POA: Diagnosis not present

## 2023-03-10 DIAGNOSIS — E1122 Type 2 diabetes mellitus with diabetic chronic kidney disease: Secondary | ICD-10-CM | POA: Diagnosis not present

## 2023-03-10 DIAGNOSIS — I129 Hypertensive chronic kidney disease with stage 1 through stage 4 chronic kidney disease, or unspecified chronic kidney disease: Secondary | ICD-10-CM | POA: Diagnosis not present

## 2023-03-10 DIAGNOSIS — N183 Chronic kidney disease, stage 3 unspecified: Secondary | ICD-10-CM | POA: Diagnosis not present

## 2023-03-10 DIAGNOSIS — E7800 Pure hypercholesterolemia, unspecified: Secondary | ICD-10-CM | POA: Diagnosis not present

## 2023-03-10 DIAGNOSIS — E034 Atrophy of thyroid (acquired): Secondary | ICD-10-CM | POA: Diagnosis not present

## 2023-03-10 DIAGNOSIS — Z0001 Encounter for general adult medical examination with abnormal findings: Secondary | ICD-10-CM | POA: Diagnosis not present

## 2023-03-10 DIAGNOSIS — Z87891 Personal history of nicotine dependence: Secondary | ICD-10-CM | POA: Diagnosis not present

## 2023-03-10 DIAGNOSIS — K219 Gastro-esophageal reflux disease without esophagitis: Secondary | ICD-10-CM | POA: Diagnosis not present

## 2023-03-16 DIAGNOSIS — J019 Acute sinusitis, unspecified: Secondary | ICD-10-CM | POA: Diagnosis not present

## 2023-03-16 DIAGNOSIS — B9689 Other specified bacterial agents as the cause of diseases classified elsewhere: Secondary | ICD-10-CM | POA: Diagnosis not present

## 2023-03-25 DIAGNOSIS — H04123 Dry eye syndrome of bilateral lacrimal glands: Secondary | ICD-10-CM | POA: Diagnosis not present

## 2023-03-25 DIAGNOSIS — H02403 Unspecified ptosis of bilateral eyelids: Secondary | ICD-10-CM | POA: Diagnosis not present

## 2023-04-20 DIAGNOSIS — E1122 Type 2 diabetes mellitus with diabetic chronic kidney disease: Secondary | ICD-10-CM | POA: Diagnosis not present

## 2023-04-20 DIAGNOSIS — I10 Essential (primary) hypertension: Secondary | ICD-10-CM | POA: Diagnosis not present

## 2023-04-20 DIAGNOSIS — R051 Acute cough: Secondary | ICD-10-CM | POA: Diagnosis not present

## 2023-04-20 DIAGNOSIS — R509 Fever, unspecified: Secondary | ICD-10-CM | POA: Diagnosis not present

## 2023-04-20 DIAGNOSIS — Z03818 Encounter for observation for suspected exposure to other biological agents ruled out: Secondary | ICD-10-CM | POA: Diagnosis not present

## 2023-04-20 DIAGNOSIS — D631 Anemia in chronic kidney disease: Secondary | ICD-10-CM | POA: Diagnosis not present

## 2023-04-20 DIAGNOSIS — N1832 Chronic kidney disease, stage 3b: Secondary | ICD-10-CM | POA: Diagnosis not present

## 2023-04-20 DIAGNOSIS — U071 COVID-19: Secondary | ICD-10-CM | POA: Diagnosis not present

## 2023-06-16 DIAGNOSIS — E119 Type 2 diabetes mellitus without complications: Secondary | ICD-10-CM | POA: Diagnosis not present

## 2023-06-16 DIAGNOSIS — E034 Atrophy of thyroid (acquired): Secondary | ICD-10-CM | POA: Diagnosis not present

## 2023-06-23 DIAGNOSIS — N183 Chronic kidney disease, stage 3 unspecified: Secondary | ICD-10-CM | POA: Diagnosis not present

## 2023-06-23 DIAGNOSIS — E7800 Pure hypercholesterolemia, unspecified: Secondary | ICD-10-CM | POA: Diagnosis not present

## 2023-06-23 DIAGNOSIS — Z87891 Personal history of nicotine dependence: Secondary | ICD-10-CM | POA: Diagnosis not present

## 2023-06-23 DIAGNOSIS — G629 Polyneuropathy, unspecified: Secondary | ICD-10-CM | POA: Diagnosis not present

## 2023-06-23 DIAGNOSIS — E1122 Type 2 diabetes mellitus with diabetic chronic kidney disease: Secondary | ICD-10-CM | POA: Diagnosis not present

## 2023-06-23 DIAGNOSIS — K219 Gastro-esophageal reflux disease without esophagitis: Secondary | ICD-10-CM | POA: Diagnosis not present

## 2023-06-23 DIAGNOSIS — E034 Atrophy of thyroid (acquired): Secondary | ICD-10-CM | POA: Diagnosis not present

## 2023-06-23 DIAGNOSIS — I129 Hypertensive chronic kidney disease with stage 1 through stage 4 chronic kidney disease, or unspecified chronic kidney disease: Secondary | ICD-10-CM | POA: Diagnosis not present

## 2024-04-28 ENCOUNTER — Other Ambulatory Visit: Payer: Self-pay | Admitting: Podiatry

## 2024-04-28 DIAGNOSIS — S82851A Displaced trimalleolar fracture of right lower leg, initial encounter for closed fracture: Secondary | ICD-10-CM

## 2024-05-01 ENCOUNTER — Emergency Department
Admission: EM | Admit: 2024-05-01 | Discharge: 2024-05-01 | Disposition: A | Discharge status: Home / Self Care | Attending: Emergency Medicine | Admitting: Emergency Medicine

## 2024-05-01 ENCOUNTER — Emergency Department

## 2024-05-01 ENCOUNTER — Other Ambulatory Visit: Payer: Self-pay

## 2024-05-01 DIAGNOSIS — X501XXA Overexertion from prolonged static or awkward postures, initial encounter: Secondary | ICD-10-CM | POA: Diagnosis not present

## 2024-05-01 DIAGNOSIS — S82401A Unspecified fracture of shaft of right fibula, initial encounter for closed fracture: Secondary | ICD-10-CM

## 2024-05-01 DIAGNOSIS — S82434A Nondisplaced oblique fracture of shaft of right fibula, initial encounter for closed fracture: Secondary | ICD-10-CM | POA: Diagnosis not present

## 2024-05-01 DIAGNOSIS — M25571 Pain in right ankle and joints of right foot: Secondary | ICD-10-CM | POA: Diagnosis present

## 2024-05-01 MED ORDER — OXYCODONE-ACETAMINOPHEN 7.5-325 MG PO TABS: 1.0000 | ORAL_TABLET | ORAL | 0 refills | Status: AC | PRN

## 2024-05-01 MED ORDER — ACETAMINOPHEN 500 MG PO TABS
1000.0000 mg | ORAL_TABLET | Freq: Once | ORAL | Status: AC
  Administered 2024-05-01: 1000 mg via ORAL
  Filled 2024-05-01: qty 2

## 2024-05-01 NOTE — ED Triage Notes (Signed)
 Pt comes with right ankle pain and swelling. Pt states she twisted it week ago. Pt states she saw her pcp and had xrays done. Pt was advised taht if it didn't get better to go to Ed.

## 2024-05-01 NOTE — ED Notes (Signed)
 See triage note  Presents with injury to right ankle 2 weeks ago Ankle is swollen   No deformity noted

## 2024-05-01 NOTE — ED Provider Notes (Signed)
 [facility] Emergency Department Provider Note     Event Date/Time   First MD Initiated Contact with Patient 05/01/24 1416     (approximate)   History   Ankle Pain   HPI  Janice Galloway is a 83 y.o. female presents to the ED for evaluation of right ankle pain and swelling.  Patient reports she fell about a week ago taking out the trash and twisted her ankle going down the steps.  She states she saw her primary care provider and diagnosed with a fractured ankle.  She was instructed to wear a boot and follow-up with podiatry. She states the boot is uncomfortable, so she does not wear.  Patient is amatory however she reports significant pain to the right ankle.  Chart reviewed -office visit 07/28 for right ankle injury nondisplaced oblique fracture of distal fibula.  Patient did have a follow-up appointment with podiatry on 08/20 - Order for CT scan for possible ORIF procedure.     Physical Exam   Triage Vital Signs: ED Triage Vitals [05/01/24 1323]  Encounter Vitals Group     BP (!) 150/88     Girls Systolic BP Percentile      Girls Diastolic BP Percentile      Boys Systolic BP Percentile      Boys Diastolic BP Percentile      Pulse Rate 68     Resp 18     Temp 98 F (36.7 C)     Temp src      SpO2 100 %     Weight 136 lb (61.7 kg)     Height 5' 5 (1.651 m)     Head Circumference      Peak Flow      Pain Score 8     Pain Loc      Pain Education      Exclude from Growth Chart     Most recent vital signs: Vitals:   05/01/24 1323 05/01/24 1559  BP: (!) 150/88 (!) 153/67  Pulse: 68 60  Resp: 18 16  Temp: 98 F (36.7 C) 97.7 F (36.5 C)  SpO2: 100% 96%    General Awake, no distress.  HEENT NCAT.  CV:  Good peripheral perfusion.  RESP:  Normal effort.  ABD:  No distention.  Other:  Moderate circumferential swelling to the right ankle.  More prominent to lateral aspect.  Limited range of motion secondary to swelling and pain.   Sensation intact.   ED Results / Procedures / Treatments   Labs (all labs ordered are listed, but only abnormal results are displayed) Labs Reviewed - No data to display  RADIOLOGY  I personally viewed and evaluated these images as part of my medical decision making, as well as reviewing the written report by the radiologist.  ED Provider Interpretation: Comminuted oblique fracture of the distal fibula  DG Ankle Complete Right Result Date: 05/01/2024 CLINICAL DATA:  Trauma to the right ankle. EXAM: RIGHT ANKLE - COMPLETE 3+ VIEW COMPARISON:  None Available. FINDINGS: There is a comminuted appearing oblique fracture of the distal fibula. No dislocation. The bones are osteopenic. The ankle mortise is intact. The soft tissue swelling of the ankle. No recurrent object or soft tissue gas. IMPRESSION: Comminuted appearing oblique fracture of the distal fibula. Electronically Signed   By: Vanetta Chou M.D.   On: 05/01/2024 14:51    PROCEDURES:  Critical Care performed: No  Procedures   MEDICATIONS ORDERED IN ED:  Medications  acetaminophen  (TYLENOL ) tablet 1,000 mg (1,000 mg Oral Given 05/01/24 1516)     IMPRESSION / MDM / ASSESSMENT AND PLAN / ED COURSE  I reviewed the triage vital signs and the nursing notes.                               83 y.o. female presents to the emergency department for evaluation and treatment of right ankle injury. See HPI for further details.   Patient's presentation is most consistent with acute complicated illness / injury requiring diagnostic workup.  Patient has a known nondisplaced oblique fracture of the distal fibula of her right ankle.  Has follow-up with podiatry.  Has been taking Tylenol  for pain with no relief.  Patient is driving home today with her brother who is autistic and nonverbal.  Will send stronger medication to pharmacy for her to take at home.  X-ray obtained in triage showing a comminuted oblique fracture of the distal fibula,  the ankle mortise remains intact.   Shared visit with supervising attending Dr. Arlander, who advised follow-up with podiatry for further evaluation. We will provide pain control.   Thorough discussion with patient to remain in walking boot at all times until she can follow-up with podiatry for further evaluation was provided to patient.  She was also given an Ace bandage to wrap her foot for compression at night.  She verbalized understanding.  RICE therapy education was provided to the patient.  She is in stable condition for discharge home.  ED return precaution discussed.  FINAL CLINICAL IMPRESSION(S) / ED DIAGNOSES   Final diagnoses:  Closed fracture of shaft of right fibula, unspecified fracture morphology, initial encounter   Rx / DC Orders   ED Discharge Orders          Ordered    oxyCODONE -acetaminophen  (PERCOCET) 7.5-325 MG tablet  Every 4 hours PRN        05/01/24 1530            Note:  This document was prepared using Dragon voice recognition software and may include unintentional dictation errors.    Margrette, Daphna Lafuente A, PA-C 05/01/24 1734    Arlander [place], MD 05/01/24 RONOLD

## 2024-05-01 NOTE — Discharge Instructions (Addendum)
 You were seen in the ED for a right ankle injury.  Your x-ray shows a comminuted appearing oblique fracture of the distal fibula.  You will need to follow-up with podiatry for further evaluation.  You will need to stay in your walking boot and wear it at all times except for at night.  You will need to wear the Ace bandage provided for you today in place of the boot until you can make your follow-up appointment.

## 2024-05-05 ENCOUNTER — Ambulatory Visit
Admission: RE | Admit: 2024-05-05 | Discharge: 2024-05-05 | Disposition: A | Discharge status: Home / Self Care | Source: Ambulatory Visit | Attending: Podiatry | Admitting: Podiatry

## 2024-05-05 DIAGNOSIS — S82851A Displaced trimalleolar fracture of right lower leg, initial encounter for closed fracture: Secondary | ICD-10-CM | POA: Diagnosis present
